# Patient Record
Sex: Male | Born: 1980
Health system: Southern US, Community
[De-identification: ages and names within clinical notes are randomized; demographics above are authoritative.]

## PROBLEM LIST (undated history)

## (undated) DIAGNOSIS — R011 Cardiac murmur, unspecified: Secondary | ICD-10-CM

## (undated) DIAGNOSIS — F329 Major depressive disorder, single episode, unspecified: Secondary | ICD-10-CM

## (undated) DIAGNOSIS — F419 Anxiety disorder, unspecified: Secondary | ICD-10-CM

## (undated) DIAGNOSIS — F32A Depression, unspecified: Secondary | ICD-10-CM

## (undated) DIAGNOSIS — T7840XA Allergy, unspecified, initial encounter: Secondary | ICD-10-CM

## (undated) HISTORY — DX: Cardiac murmur, unspecified: R01.1

## (undated) HISTORY — DX: Anxiety disorder, unspecified: F41.9

## (undated) HISTORY — DX: Depression, unspecified: F32.A

## (undated) HISTORY — DX: Allergy, unspecified, initial encounter: T78.40XA

---
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
## 2005-12-16 ENCOUNTER — Ambulatory Visit: Payer: Self-pay | Admitting: Pulmonary Disease

## 2006-01-10 ENCOUNTER — Ambulatory Visit: Payer: Self-pay | Admitting: Pulmonary Disease

## 2008-04-08 DIAGNOSIS — T7840XA Allergy, unspecified, initial encounter: Secondary | ICD-10-CM | POA: Insufficient documentation

## 2008-04-08 DIAGNOSIS — F988 Other specified behavioral and emotional disorders with onset usually occurring in childhood and adolescence: Secondary | ICD-10-CM

## 2008-04-09 ENCOUNTER — Ambulatory Visit: Payer: Self-pay | Admitting: Pulmonary Disease

## 2008-04-09 DIAGNOSIS — L708 Other acne: Secondary | ICD-10-CM | POA: Insufficient documentation

## 2008-04-13 LAB — CONVERTED CEMR LAB: Herpes Simplex Vrs I&II-IgM Ab (EIA): NOT DETECTED

## 2008-09-30 ENCOUNTER — Ambulatory Visit: Payer: Self-pay | Admitting: Pulmonary Disease

## 2009-10-22 IMAGING — CR DG CHEST 2V
3 series · 3 of 3 positions shown · non-contrast
Comparison: 12/16/2005

CLINICAL DATA: None

CHEST - 2 VIEW

[view not recorded (1 of 3)]
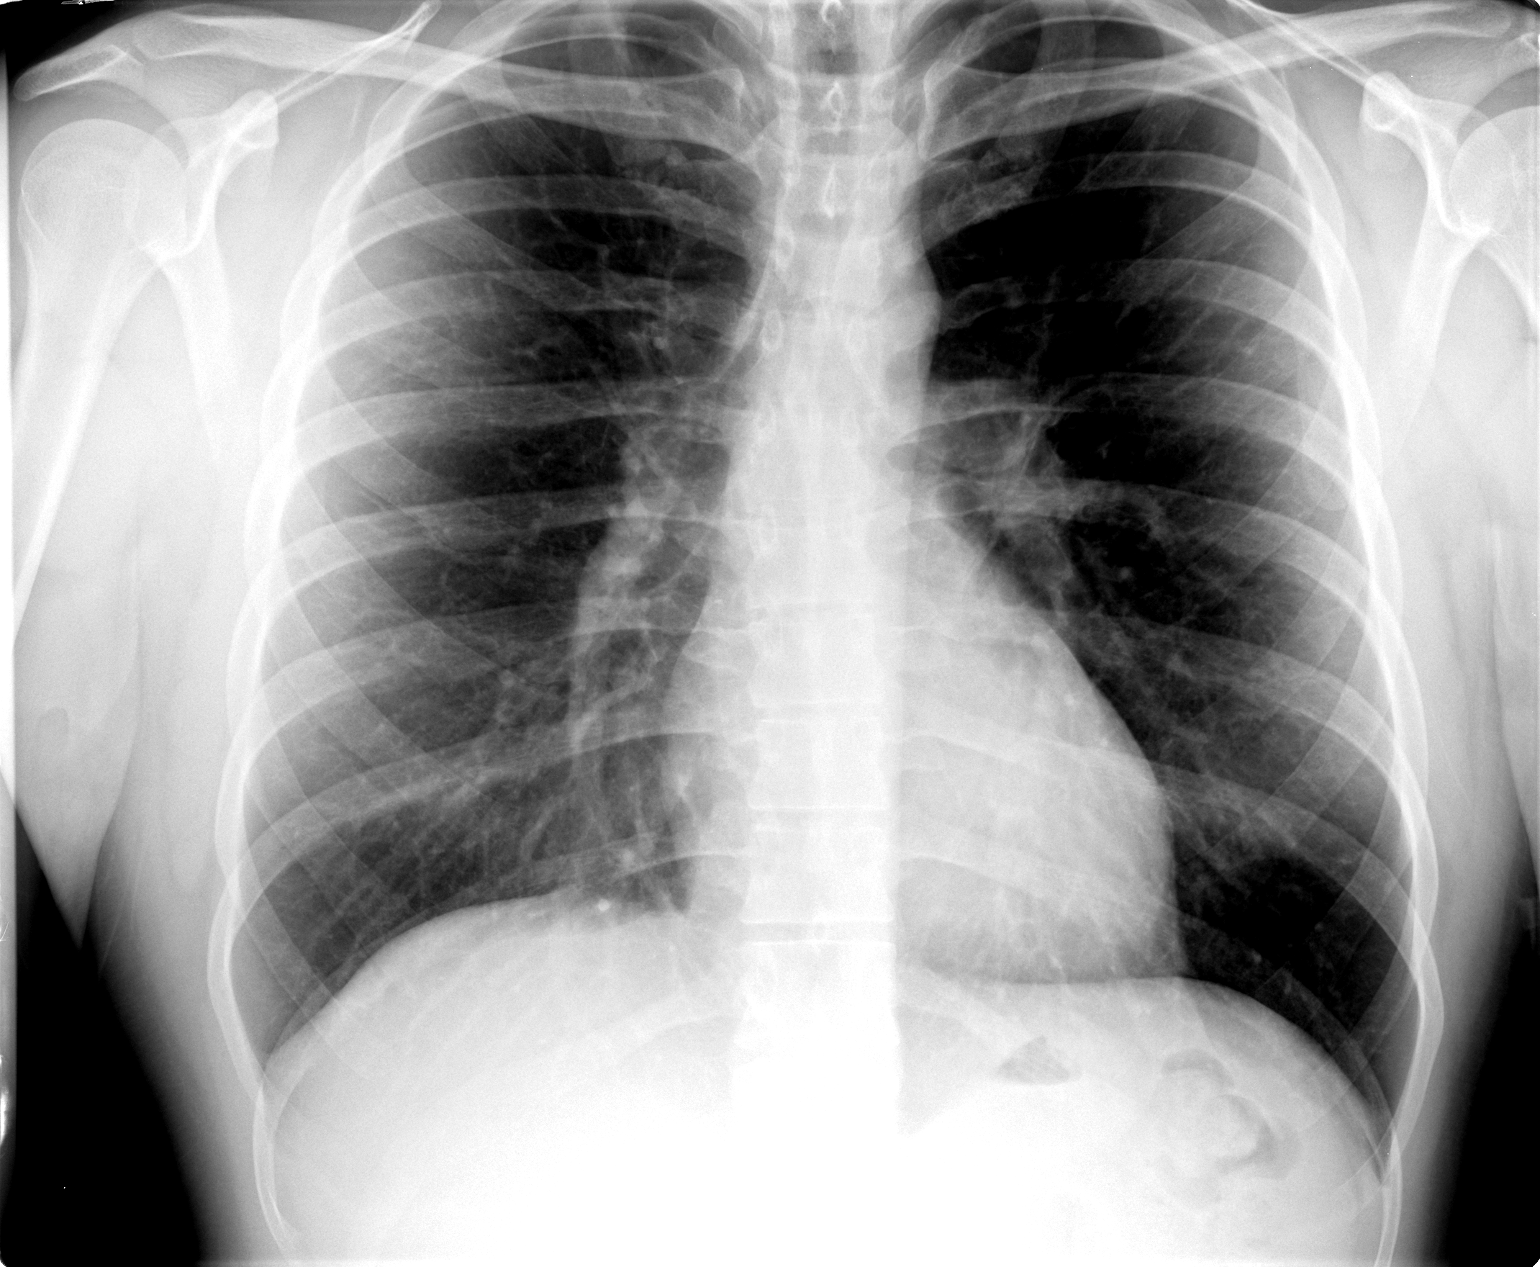

[view not recorded (2 of 3)]
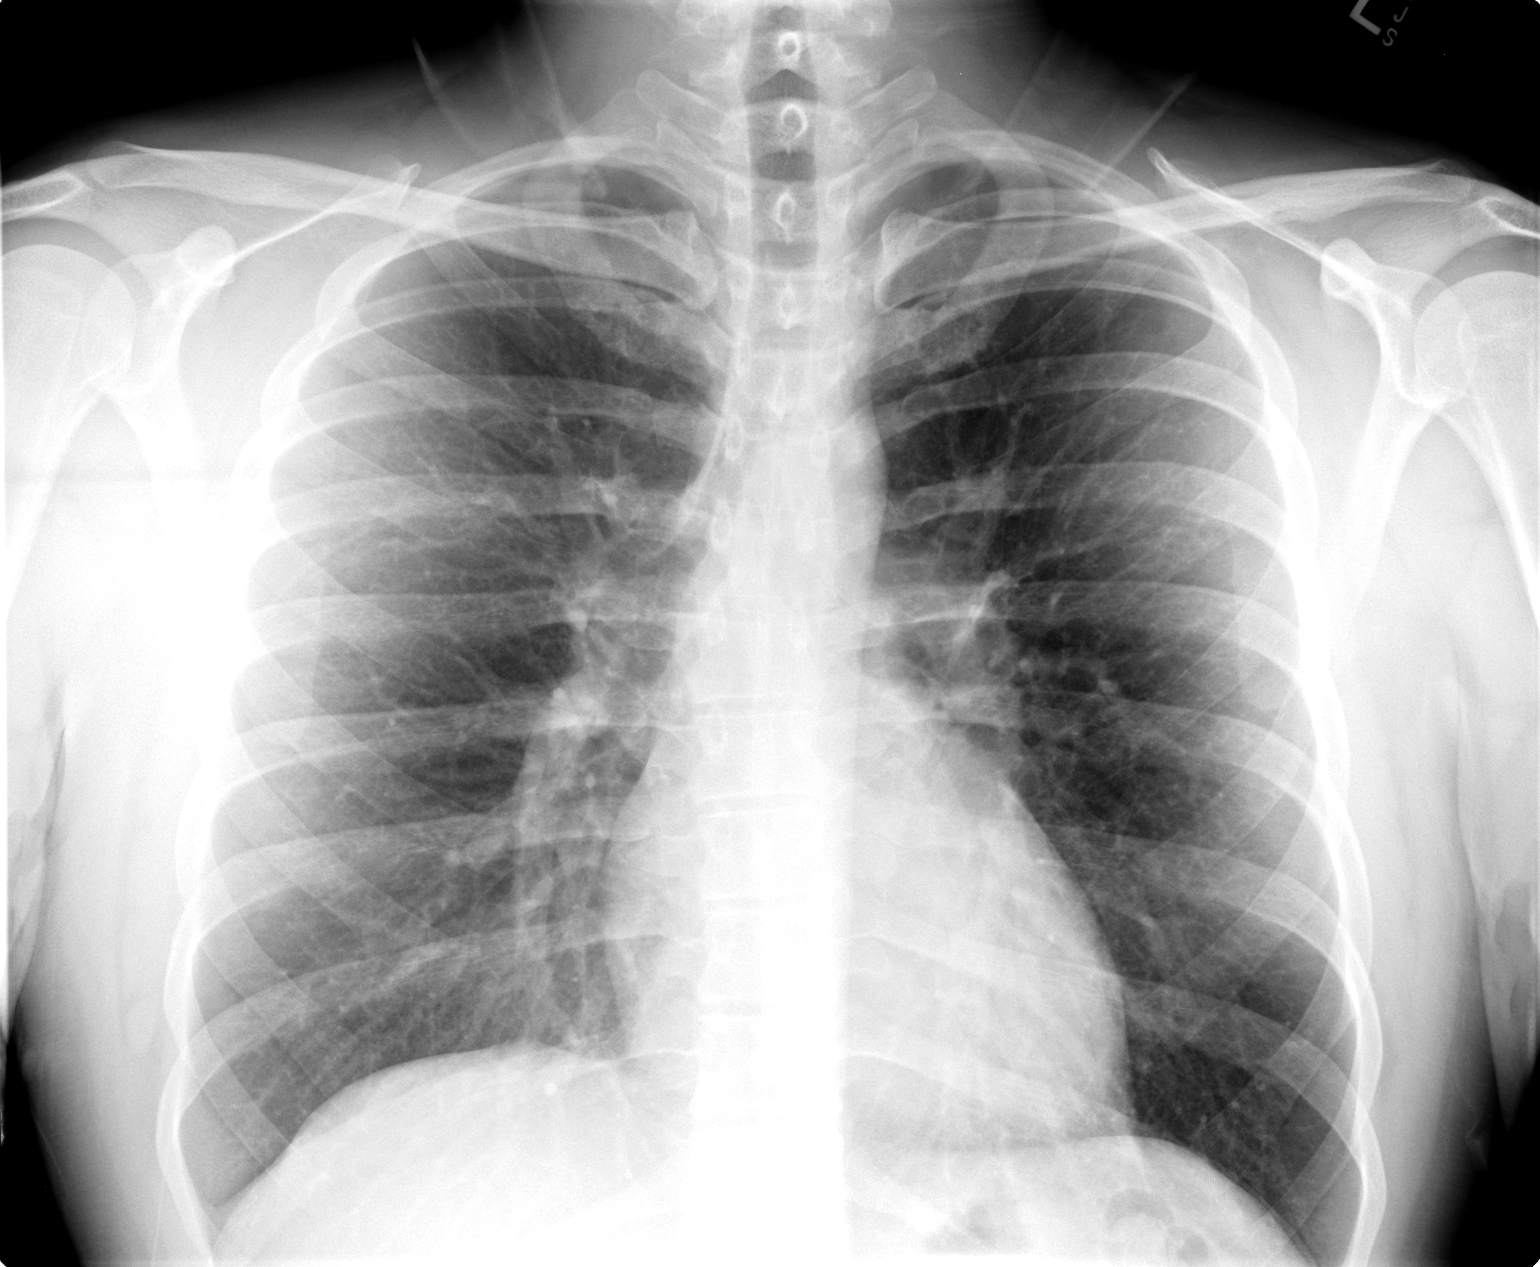

[view not recorded (3 of 3)]
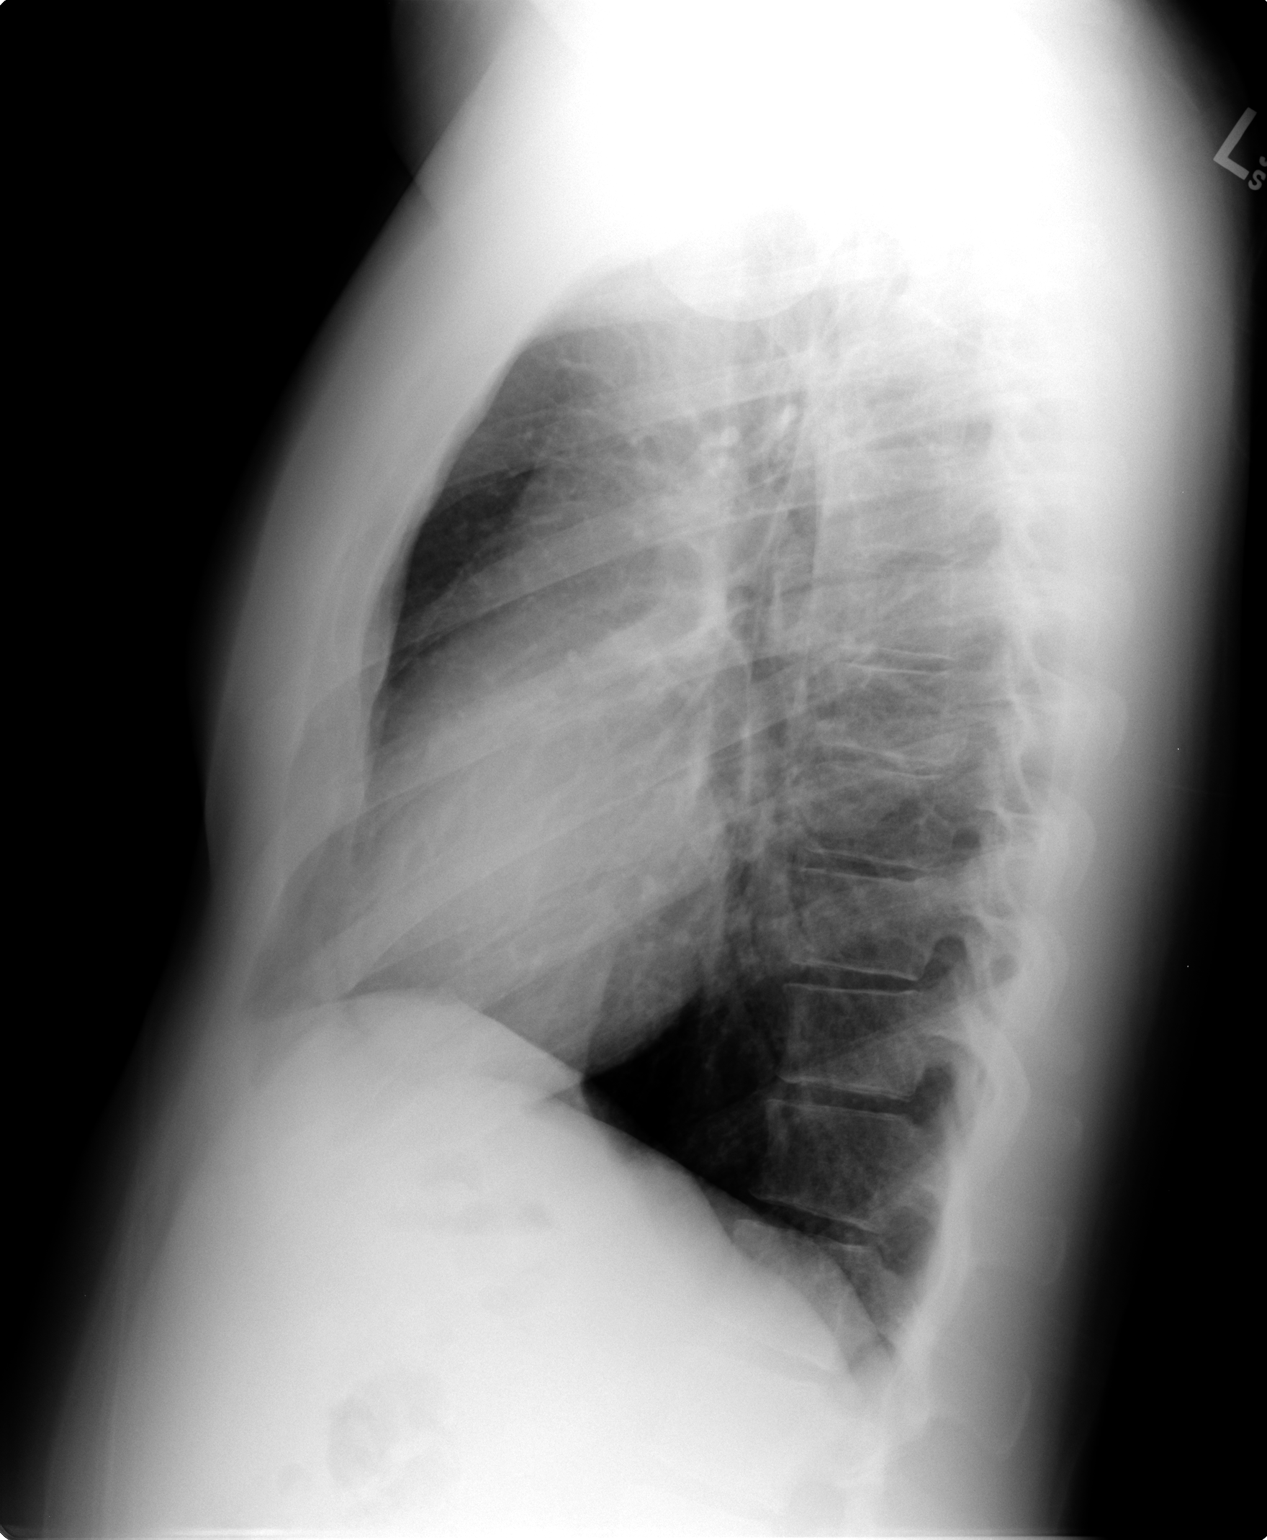

[3 of 3 positions shown; findings below may reference images not displayed]

FINDINGS: The heart size and mediastinal contours are within normal
limits.  Both lungs are clear.  The visualized skeletal structures
are unremarkable.
IMPRESSION: No active disease.,

## 2010-07-16 ENCOUNTER — Encounter: Payer: Self-pay | Admitting: Pulmonary Disease

## 2010-08-05 ENCOUNTER — Ambulatory Visit: Payer: Self-pay | Admitting: Pulmonary Disease

## 2010-08-05 ENCOUNTER — Telehealth (INDEPENDENT_AMBULATORY_CARE_PROVIDER_SITE_OTHER): Payer: Self-pay | Admitting: *Deleted

## 2010-08-05 DIAGNOSIS — J40 Bronchitis, not specified as acute or chronic: Secondary | ICD-10-CM

## 2010-11-14 LAB — CONVERTED CEMR LAB
Basophils Absolute: 0 10*3/uL (ref 0.0–0.1)
Bilirubin, Direct: 0.1 mg/dL (ref 0.0–0.3)
Calcium: 9.1 mg/dL (ref 8.4–10.5)
Cholesterol: 193 mg/dL (ref 0–200)
Crystals: NEGATIVE
Direct LDL: 120.7 mg/dL
GFR calc Af Amer: 114 mL/min
GFR calc non Af Amer: 95 mL/min
HCT: 42.5 % (ref 39.0–52.0)
Hemoglobin, Urine: NEGATIVE
Leukocytes, UA: NEGATIVE
Lymphocytes Relative: 32.6 % (ref 12.0–46.0)
Monocytes Absolute: 0.5 10*3/uL (ref 0.1–1.0)
Monocytes Relative: 9.8 % (ref 3.0–12.0)
Nitrite: NEGATIVE
Platelets: 267 10*3/uL (ref 150–400)
RDW: 11.7 % (ref 11.5–14.6)
Sodium: 136 meq/L (ref 135–145)
TSH: 1.14 microintl units/mL (ref 0.35–5.50)
Total Bilirubin: 1.2 mg/dL (ref 0.3–1.2)
Total CHOL/HDL Ratio: 4.2
Total Protein, Urine: NEGATIVE mg/dL
Triglycerides: 216 mg/dL (ref 0–149)
Urobilinogen, UA: 0.2 (ref 0.0–1.0)
VLDL: 43 mg/dL — ABNORMAL HIGH (ref 0–40)

## 2010-11-18 NOTE — Letter (Signed)
Summary: [facility]  [facility]   Imported By: Lester Benson 07/27/2010 07:55:41  _____________________________________________________________________  External Attachment:    Type:   Image     Comment:   External Document

## 2010-11-18 NOTE — Assessment & Plan Note (Signed)
Summary: ?bronchitis/ok per TD and SN/coming at 2:30pm/mg   CC:  31 month ROV & add-on for bronchitis....  History of Present Illness: 30 y/o WM here for a 2 yr follow up visit & add-on appt for bronchitis...    ~  Jun09:  he enjoys excellent general medical health and has no new complaints or concerns today... he has Adult ADD and is followed by DrReadling in [place] on ADDERALL 10mg - 1.5tabsBid, ZOLOFT 100mg /d, and WELLBUTRIN 100mg /d... and he has PROPRANOLOL to take as needed for anxiety before performances...   ~  August 05, 2010:  add-on appt for URI w/ nasal congestion, drainage, burning sensation and subseq cough, yellow sputum production (no blood, no f/c/s)... he denies CP, palpit, SOB, etc...  he is getting married in 2 weeks!... Exam shows scat rhonchi & CXR= clear, NAD... we decided to Rx w/ Augmentin, Mucinex, Tussionex...   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  PHYSICAL EXAMINATION (ICD-V70.0) - good general health...  ~  FLP 3/07 showed TChol 183, TG 144, HDL 57, LDL 98... continue diet...  ~  10/11:  he has gained 40# over the last 31yrs...  ALLERGY (ICD-995.3) - uses OTC meds as needed.  ADD (ICD-314.00) - followed by Dr Harvie Heck Reidling in [place] on Adderall 10mg - 1.5 tabs Bid, Zoloft 100mg /d, and Wellbutrin 100mg /d...  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     Preventive Screening-Counseling & Management  Alcohol-Tobacco     Smoking Status: never  Allergies: 1)  ! Prednisone  Comments:  Nurse/Medical Assistant: The patient's medications and allergies were reviewed with the patient and were updated in the Medication and Allergy Lists.  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 mother alive age 64 father alive age 50--hx of colon  cancer no siblings  Social History: Reviewed history from 04/09/2008 and no changes required. musician/performer/composer smokes very rarely drinks rarely single no children  Review of Systems      See HPI       The patient complains of dyspnea on exertion and prolonged cough.  The patient denies anorexia, fever, weight loss, weight gain, vision loss, decreased hearing, hoarseness, chest pain, syncope, peripheral edema, headaches, hemoptysis, abdominal pain, melena, hematochezia, severe indigestion/heartburn, hematuria, incontinence, muscle weakness, suspicious skin lesions, transient blindness, difficulty walking, depression, unusual weight change, abnormal bleeding, enlarged lymph nodes, and angioedema.    Vital Signs:  Patient profile:   30 year old male Height:      71 inches Weight:      228 pounds BMI:     31.91 O2 Sat:      97 % on Room air Temp:     97.4 degrees F oral Pulse rate:   113 / minute BP sitting:   140 / 90  (left arm) Cuff size:   regular  Vitals Entered By: Randell Loop CMA (August 05, 2010 3:09 PM)  O2 Sat at Rest %:  97 O2 Flow:  Room air CC: 28 month ROV & add-on for bronchitis... Is Patient Diabetic? No Pain Assessment Patient in pain? no      Comments meds updated today with pt   Physical Exam  Additional Exam:  WD, WN, 30 y/o WM in NAD... GENERAL:  Alert & oriented; pleasant & cooperative... HEENT:  [place]/AT, EOM-wnl, PERRLA, Fundi-benign, EACs-clear,  TMs-wnl, NOSE-clear, THROAT-clear & wnl., PERRLA, Fundi-benign, EACs-clear,  TMs-wnl, NOSE-clear, THROAT-clear & wnl. NECK:  Supple w/ full ROM; no JVD; normal carotid impulses w/o bruits; no thyromegaly or nodules palpated; no lymphadenopathy. CHEST:  Clear to P & A; except for a few scat rhonchi... HEART:  Regular Rhythm; without murmurs/ rubs/ or gallops. ABDOMEN:  Soft & nontender; normal bowel sounds; no organomegaly or masses detected. RECTAL:  Neg - prostate 2+ & nontender w/o nodules; stool hematest neg. EXT: without deformities or arthritic changes; no  varicose veins/ venous insuffic/ or edema. NEURO:  CN's intact; motor testing normal; sensory testing normal; gait normal & balance OK. DERM:  No lesions noted; no rash etc...    CXR  Procedure date:  08/05/2010  Findings:      CHEST - 2 VIEW Comparison: 04/09/2008   Findings: The heart size and vascularity are normal and the lungs are clear.  No osseous abnormality.   IMPRESSION: Normal chest.   Read By:  Gwynn Burly,  M.D.   Impression & Recommendations:  Problem # 1:  BRONCHITIS (ICD-490) We discussed Rx w/ Augmentin, Mucinex, Fluids, & Tussionex Prn... The following medications were removed from the medication list:    Septra 400-80 Mg Tabs (Sulfamethoxazole-trimethoprim) .Marland Kitchen... Take one tablet by mouth two times a day His updated medication list for this problem includes:    Amoxicillin-pot Clavulanate 875-125 Mg Tabs (Amoxicillin-pot clavulanate) .Marland Kitchen... Take 1 tab by mouth two times a day til gone...    Tussionex Pennkinetic Er 10-8 Mg/78ml Lqcr (Hydrocod polst-chlorphen polst) .Marland Kitchen... 1 tsp by mouth every 12 h as needed for cough...  Orders: T-2 View CXR ([id number])  Problem # 2:  OTHER MEDICAL ISSUES AS NOTED>>> He will sche a f/u CPX athis convenience...  Complete Medication List: 1)  Adderall 10 Mg Tabs (Amphetamine-dextroamphetamine) .... Take 2  tablet by mouth two times a day 2)  Zoloft 100 Mg Tabs (Sertraline hcl) .... Take 1 tablet by mouth once a day 3)  Wellbutrin 100 Mg Tabs (Bupropion hcl) .... Take 1 tablet by mouth once a day 4)  Benadryl 25 Mg Caps (Diphenhydramine hcl) .... As needed 5)  Amoxicillin-pot Clavulanate 875-125 Mg Tabs (Amoxicillin-pot clavulanate) .... Take 1 tab by mouth two times a day til gone... 6)  Tussionex Pennkinetic Er 10-8 Mg/55ml Lqcr (Hydrocod polst-chlorphen polst) .Marland Kitchen.. 1 tsp by mouth every 12 h as needed for cough...  Patient Instructions: 1)  Today we updated your med list- see below.... 2)  We decided to treat your  bronchitic infection w/ AUGMENTIN take 1 tab twice daily til gone... and we gave you a perscription for TUSSIONEX to use as needed for cough... 3)  Don't forget the MUCINEX DM- 1-2 tabs twice daily w/ plenty of fluids.Marland KitchenMarland Kitchen 4)  Call for any problems.Marland KitchenMarland Kitchen 5)  Work on weight reduction & consider a follow up physical next year... Prescriptions: TUSSIONEX PENNKINETIC ER 10-8 MG/5ML LQCR (HYDROCOD POLST-CHLORPHEN POLST) 1 tsp by mouth every 12 H as needed for cough...  #4 oz x 2   Entered and Authorized by:   Michele Mcalpine MD   Signed by:   Michele Mcalpine MD on 08/05/2010   Method used:   Print then Give to Patient   RxID:   [id number] AMOXICILLIN-POT CLAVULANATE 875-125 MG TABS (AMOXICILLIN-POT CLAVULANATE) take 1 tab by mouth two times a day til gone...  #14 x 1   Entered and Authorized by:   Michele Mcalpine MD   Signed by:   Michele Mcalpine MD on 08/05/2010  Method used:   Print then Give to Patient   RxID:   [id number]    Immunization History:  Influenza Immunization History:    Influenza:  historical (08/05/2008)

## 2010-11-18 NOTE — Progress Notes (Signed)
Summary: cough/ congestion----ov with SN today  Phone Note Call from Patient Call back at [phone]   Caller: Patient Call For: nadel Summary of Call: chest pains cough and yellow congestion [facility] Initial call taken by: Rickard Patience,  August 05, 2010 8:41 AM  Follow-up for Phone Call        called and spoke with pt. pt believes he may have bronchitis.  Pt c/o chest congestion and coughing up yellow sputum.  Pt hasn't seen SN since June 2009.  Spoke with SN and he ok'd pt to be seen today.  Pt agreed to come in today at 2:30pm for appt with SN.  Aundra Millet Reynolds LPN  August 05, 2010 10:15 AM

## 2011-11-02 ENCOUNTER — Telehealth: Payer: Self-pay | Admitting: Pulmonary Disease

## 2011-11-02 MED ORDER — AMOXICILLIN-POT CLAVULANATE 875-125 MG PO TABS: 1.0000 | ORAL_TABLET | Freq: Two times a day (BID) | ORAL | Status: AC

## 2011-11-02 NOTE — Telephone Encounter (Signed)
Per SN---ok for pt to have augmentin 875mg  #14  1 po bid and align 1 daily while taking the abx.  thanks

## 2011-11-02 NOTE — Telephone Encounter (Signed)
Spoke with pt and he states has had  Cough x 2 days- prod with very minimal light yellow sputum. Denies any f/c/s, SOB, CP, wheeze. He would like something called in before he gets worse. Pt last seen Oct 2011. SN nor TP with any openings today. Please advise, thanks! Allergies  Allergen Reactions  . Prednisone     REACTION: pt states "jittery" from Pred

## 2011-11-02 NOTE — Telephone Encounter (Signed)
Spoke with pt and notified of recs per SN. Pt verbalized understanding and states nothing further needed. Rx was called to pharm.

## 2011-11-05 ENCOUNTER — Ambulatory Visit (INDEPENDENT_AMBULATORY_CARE_PROVIDER_SITE_OTHER): Payer: BC Managed Care – PPO

## 2011-11-05 DIAGNOSIS — B9789 Other viral agents as the cause of diseases classified elsewhere: Secondary | ICD-10-CM

## 2012-02-17 IMAGING — CR DG CHEST 2V
2 series · 2 of 2 positions shown · non-contrast
Comparison: 04/09/2008

CLINICAL DATA: Bronchitis.  Cough.

CHEST - 2 VIEW

[view not recorded (1 of 2)]
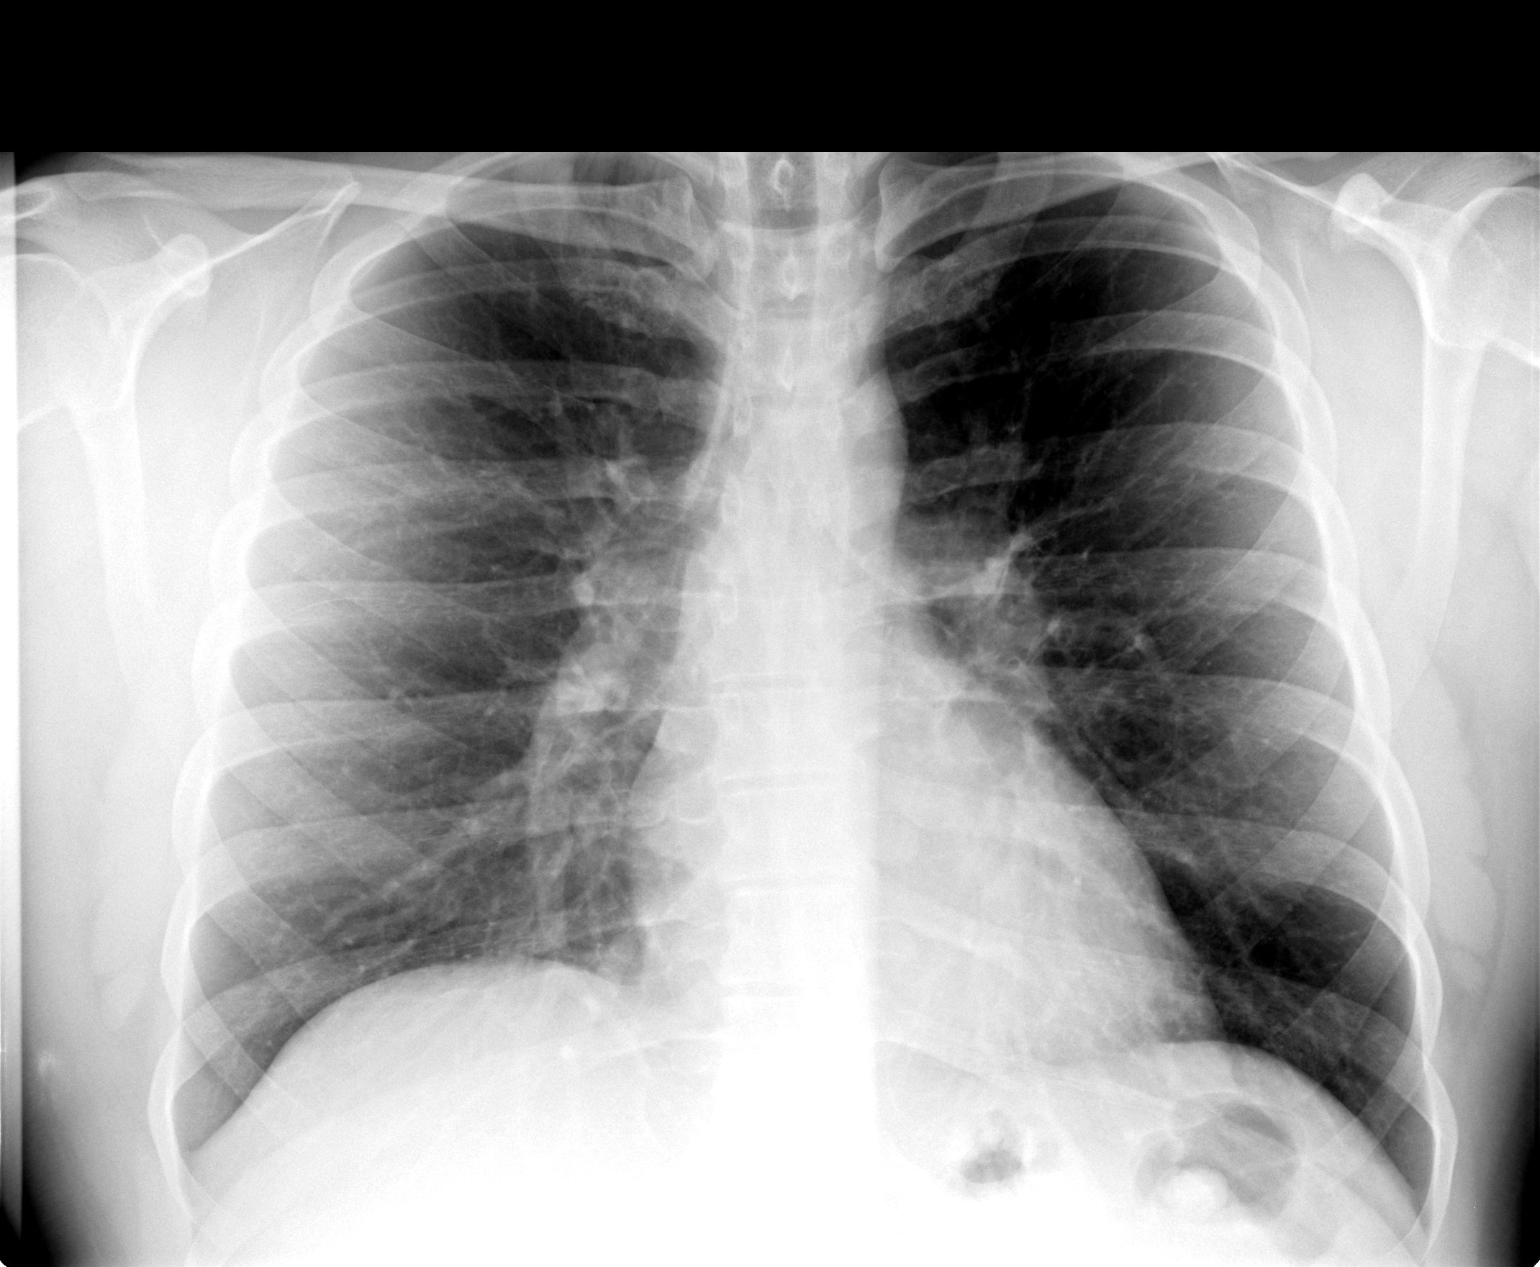

[view not recorded (2 of 2)]
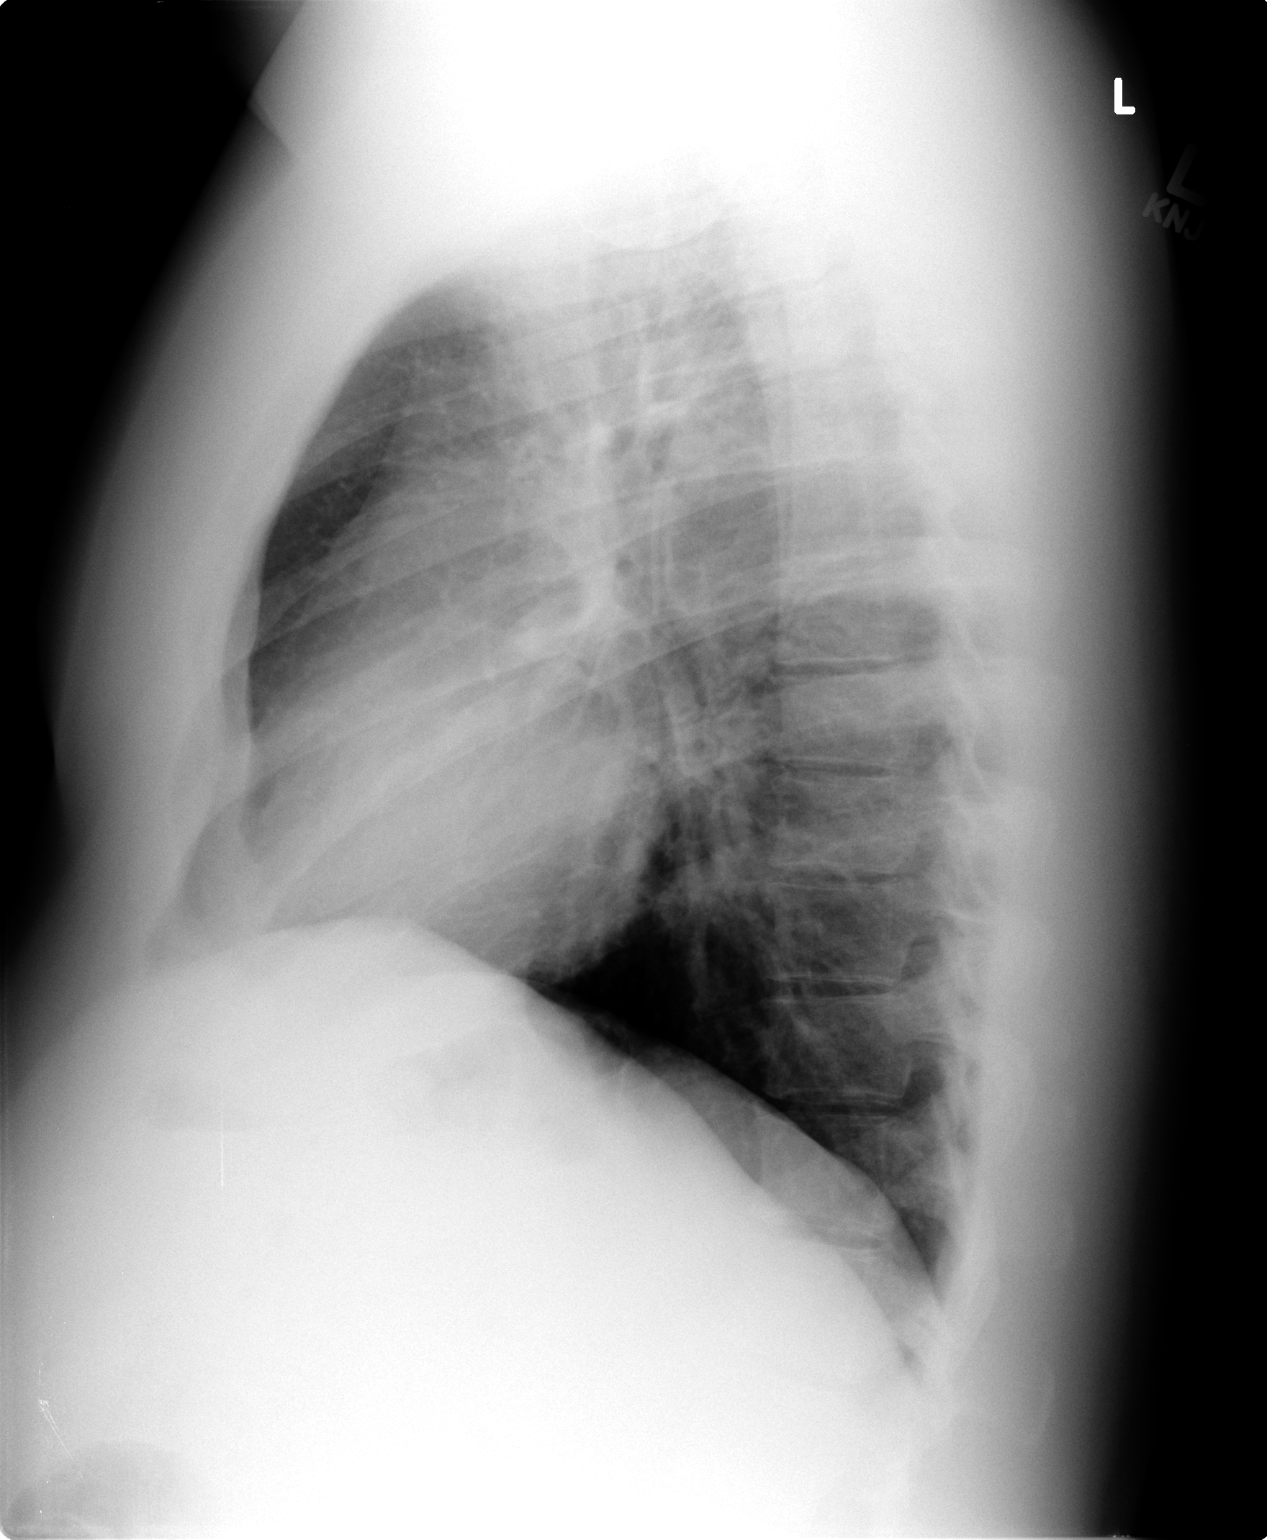

[2 of 2 positions shown; findings below may reference images not displayed]

FINDINGS: The heart size and vascularity are normal and the lungs
are clear.  No osseous abnormality.
IMPRESSION: Normal chest.

## 2012-07-03 ENCOUNTER — Ambulatory Visit (INDEPENDENT_AMBULATORY_CARE_PROVIDER_SITE_OTHER): Payer: BC Managed Care – PPO | Admitting: Family Medicine

## 2012-07-03 VITALS — BP 130/90 | HR 82 | Temp 98.8°F | Resp 18 | Ht 71.5 in | Wt 236.0 lb

## 2012-07-03 DIAGNOSIS — S93409A Sprain of unspecified ligament of unspecified ankle, initial encounter: Secondary | ICD-10-CM

## 2012-07-03 NOTE — Patient Instructions (Addendum)
Thank you for coming in today. You have an ankle sprain. Please wear the brace when active. Come back as needed.  Acute Ankle Sprain with Phase I Rehab An acute ankle sprain is a partial or complete tear in one or more of the ligaments of the ankle due to traumatic injury. The severity of the injury depends on both the the number of ligaments sprained and the grade of sprain. There are 3 grades of sprains.    A grade 1 sprain is a mild sprain. There is a slight pull without obvious tearing. There is no loss of strength, and the muscle and ligament are the correct length.   A grade 2 sprain is a moderate sprain. There is tearing of fibers within the substance of the ligament where it connects two bones or two cartilages. The length of the ligament is increased, and there is usually decreased strength.   A grade 3 sprain is a complete rupture of the ligament and is uncommon.  In addition to the grade of sprain, there are three types of ankle sprains.   Lateral ankle sprains: This is a sprain of one or more of the three ligaments on the outer side (lateral) of the ankle. These are the most common sprains. Medial ankle sprains: There is one large triangular ligament of the inner side (medial) of the ankle that is susceptible to injury. Medial ankle sprains are less common. Syndesmosis, "high ankle," sprains: The syndesmosis is the ligament that connects the two bones of the lower leg. Syndesmosis sprains usually only occur with very severe ankle sprains. SYMPTOMS  Pain, tenderness, and swelling in the ankle, starting at the side of injury that may progress to the whole ankle and foot with time.   "Pop" or tearing sensation at the time of injury.   Bruising that may spread to the heel.   Impaired ability to walk soon after injury.  CAUSES    Acute ankle sprains are caused by trauma placed on the ankle that temporarily forces or pries the anklebone (talus) out of its normal socket.    Stretching or tearing of the ligaments that normally hold the joint in place (usually due to a twisting injury).  RISK INCREASES WITH:  Previous ankle sprain.   Sports in which the foot may land awkwardly (ie. basketball, volleyball, or soccer) or walking or running on uneven or rough surfaces.   Shoes with inadequate support to prevent sideways motion when stress occurs.   Poor strength and flexibility.   Poor balance skills.   Contact sports.  PREVENTION    Warm up and stretch properly before activity.   Maintain physical fitness:   Ankle and leg flexibility, muscle strength, and endurance.   Cardiovascular fitness.   Balance training activities.   Use proper technique and have a coach correct improper technique.   Taping, protective strapping, bracing, or high-top tennis shoes may help prevent injury. Initially, tape is best; however, it loses most of its support function within 10 to 15 minutes.   Wear proper fitted protective shoes (High-top shoes with taping or bracing is more effective than either alone).   Provide the ankle with support during sports and practice activities for 12 months following injury.  PROGNOSIS    If treated properly, ankle sprains can be expected to recover completely; however, the length of recovery depends on the degree of injury.   A grade 1 sprain usually heals enough in 5 to 7 days to allow modified activity and requires  an average of 6 weeks to heal completely.   A grade 2 sprain requires 6 to 10 weeks to heal completely.   A grade 3 sprain requires 12 to 16 weeks to heal.   A syndesmosis sprain often takes more than 3 months to heal.  RELATED COMPLICATIONS    Frequent recurrence of symptoms may result in a chronic problem. Appropriately addressing the problem the first time decreases the frequency of recurrence and optimizes healing time. Severity of the initial sprain does not predict the likelihood of later instability.    Injury to other structures (bone, cartilage, or tendon).   A chronically unstable or arthritic ankle joint is a possiblity with repeated sprains.  TREATMENT Treatment initially involves the use of ice, medication, and compression bandages to help reduce pain and inflammation. Ankle sprains are usually immobilized in a walking cast or boot to allow for healing. Crutches may be recommended to reduce pressure on the injury. After immobilization, strengthening and stretching exercises may be necessary to regain strength and a full range of motion. Surgery is rarely needed to treat ankle sprains. MEDICATION    Nonsteroidal anti-inflammatory medications, such as aspirin and ibuprofen (do not take for the first 3 days after injury or within 7 days before surgery), or other minor pain relievers, such as acetaminophen, are often recommended. Take these as directed by your caregiver. Contact your caregiver immediately if any bleeding, stomach upset, or signs of an allergic reaction occur from these medications.   Ointments applied to the skin may be helpful.   Pain relievers may be prescribed as necessary by your caregiver. Do not take prescription pain medication for longer than 4 to 7 days. Use only as directed and only as much as you need.  HEAT AND COLD  Cold treatment (icing) is used to relieve pain and reduce inflammation for acute and chronic cases. Cold should be applied for 10 to 15 minutes every 2 to 3 hours for inflammation and pain and immediately after any activity that aggravates your symptoms. Use ice packs or an ice massage.   Heat treatment may be used before performing stretching and strengthening activities prescribed by your caregiver. Use a heat pack or a warm soak.  SEEK IMMEDIATE MEDICAL CARE IF:    Pain, swelling, or bruising worsens despite treatment.   You experience pain, numbness, discoloration, or coldness in the foot or toes.   New, unexplained symptoms develop (drugs  used in treatment may produce side effects.)  EXERCISES   PHASE I EXERCISES RANGE OF MOTION (ROM) AND STRETCHING EXERCISES - Ankle Sprain, Acute Phase I, Weeks 1 to 2 These exercises may help you when beginning to restore flexibility in your ankle. You will likely work on these exercises for the 1 to 2 weeks after your injury. Once your physician, physical therapist, or athletic trainer sees adequate progress, he or she will advance your exercises. While completing these exercises, remember:    Restoring tissue flexibility helps normal motion to return to the joints. This allows healthier, less painful movement and activity.   An effective stretch should be held for at least 30 seconds.   A stretch should never be painful. You should only feel a gentle lengthening or release in the stretched tissue.  RANGE OF MOTION - Dorsi/Plantar Flexion  While sitting with your right / left knee straight, draw the top of your foot upwards by flexing your ankle. Then reverse the motion, pointing your toes downward.   Hold each position for __________  seconds.   After completing your first set of exercises, repeat this exercise with your knee bent.  Repeat __________ times. Complete this exercise __________ times per day.   RANGE OF MOTION - Ankle Alphabet  Imagine your right / left big toe is a pen.   Keeping your hip and knee still, write out the entire alphabet with your "pen." Make the letters as large as you can without increasing any discomfort.  Repeat __________ times. Complete this exercise __________ times per day.   STRENGTHENING EXERCISES - Ankle Sprain, Acute -Phase I, Weeks 1 to 2 These exercises may help you when beginning to restore strength in your ankle. You will likely work on these exercises for 1 to 2 weeks after your injury. Once your physician, physical therapist, or athletic trainer sees adequate progress, he or she will advance your exercises. While completing these exercises,  remember:    Muscles can gain both the endurance and the strength needed for everyday activities through controlled exercises.   Complete these exercises as instructed by your physician, physical therapist, or athletic trainer. Progress the resistance and repetitions only as guided.   You may experience muscle soreness or fatigue, but the pain or discomfort you are trying to eliminate should never worsen during these exercises. If this pain does worsen, stop and make certain you are following the directions exactly. If the pain is still present after adjustments, discontinue the exercise until you can discuss the trouble with your clinician.  STRENGTH - Dorsiflexors  Secure a rubber exercise band/tubing to a fixed object (ie. table, pole) and loop the other end around your right / left foot.   Sit on the floor facing the fixed object. The band/tubing should be slightly tense when your foot is relaxed.   Slowly draw your foot back toward you using your ankle and toes.   Hold this position for __________ seconds. Slowly release the tension in the band and return your foot to the starting position.  Repeat __________ times. Complete this exercise __________ times per day.   STRENGTH - Plantar-flexors   Sit with your right / left leg extended. Holding onto both ends of a rubber exercise band/tubing, loop it around the ball of your foot. Keep a slight tension in the band.   Slowly push your toes away from you, pointing them downward.   Hold this position for __________ seconds. Return slowly, controlling the tension in the band/tubing.  Repeat __________ times. Complete this exercise __________ times per day.   STRENGTH - Ankle Eversion  Secure one end of a rubber exercise band/tubing to a fixed object (table, pole). Loop the other end around your foot just before your toes.   Place your fists between your knees. This will focus your strengthening at your ankle.   Drawing the band/tubing  across your opposite foot, slowly, pull your little toe out and up. Make sure the band/tubing is positioned to resist the entire motion.   Hold this position for __________ seconds.  Have your muscles resist the band/tubing as it slowly pulls your foot back to the starting position.   Repeat __________ times. Complete this exercise __________ times per day.   STRENGTH - Ankle Inversion  Secure one end of a rubber exercise band/tubing to a fixed object (table, pole). Loop the other end around your foot just before your toes.   Place your fists between your knees. This will focus your strengthening at your ankle.   Slowly, pull your big toe up  and in, making sure the band/tubing is positioned to resist the entire motion.   Hold this position for __________ seconds.   Have your muscles resist the band/tubing as it slowly pulls your foot back to the starting position.  Repeat __________ times. Complete this exercises __________ times per day.   STRENGTH - Towel Curls  Sit in a chair positioned on a non-carpeted surface.   Place your right / left foot on a towel, keeping your heel on the floor.   Pull the towel toward your heel by only curling your toes. Keep your heel on the floor.   If instructed by your physician, physical therapist, or athletic trainer, add weight to the end of the towel.  Repeat __________ times. Complete this exercise __________ times per day. Document Released: 05/04/2005 Document Revised: 09/22/2011 Document Reviewed: 01/15/2009 [facility] Patient Information 2012 [place], [place].

## 2012-07-03 NOTE — Progress Notes (Signed)
Andrew Bray is a 31 y.o. male who presents to [facility] today for left lateral ankle sprain yesterday. Patient stepped in all. Immediate pain and swelling over the lateral ankle. He is able to walk. Has tried some pain medications which are somewhat effective. Feels well otherwise. No radiating pain weakness or numbness. Worse with activity better with rest.   PMH: Reviewed ADHD History  Substance Use Topics  . Smoking status: Never Smoker   . Smokeless tobacco: Not on file  . Alcohol Use: Not on file   ROS as above  Medications reviewed. Current Outpatient Prescriptions  Medication Sig Dispense Refill  . amphetamine-dextroamphetamine (ADDERALL XR) 30 MG 24 hr capsule Take 30 mg by mouth every morning.      Marland Kitchen buPROPion (WELLBUTRIN) 100 MG tablet Take 100 mg by mouth 2 (two) times daily.      . sertraline (ZOLOFT) 100 MG tablet Take 100 mg by mouth daily.      Marland Kitchen sulfamethoxazole-trimethoprim (BACTRIM DS) 800-160 MG per tablet Take 1 tablet by mouth 2 (two) times daily.        Exam:  BP 130/90  Pulse 82  Temp 98.8 F (37.1 C) (Oral)  Resp 18  Ht 5' 11.5" (1.816 m)  Wt 236 lb (107.049 kg)  BMI 32.46 kg/m2 Gen: Well NAD MUSCULOSKELETAL ANKLE LEFT:  Swollen. Nontender throughout. Normal ankle motion. Positive talar tilt and anterior drawer. Strength is intact to all modalities. Is able to walk normally.  No results found for this or any previous visit (from the past 72 hour(s)).  Assessment and Plan: 31 y.o. male with Mild ankle sprain. I am ankle was negative for fracture. No x-rays indicated. Plan to use ASO ankle brace and NSAIDs as needed.  F/u PRN

## 2013-07-03 ENCOUNTER — Telehealth: Payer: Self-pay | Admitting: Pulmonary Disease

## 2013-07-04 ENCOUNTER — Telehealth: Payer: Self-pay | Admitting: Pulmonary Disease

## 2013-07-04 NOTE — Telephone Encounter (Signed)
I spoke with the pt and he does not want appt for next week. Carron Curie, CMA

## 2013-07-04 NOTE — Telephone Encounter (Signed)
Pt's mom calling again in ref to previous msg can be reached at [phone].Andrew Bray

## 2013-07-04 NOTE — Telephone Encounter (Signed)
Per SN---   We have no openings aval.  TP is out the rest of the week.  Best we can do is an appt next week.  We can schedule this for him.   Go to the ER is he gets worse [facility].  thanks

## 2013-07-04 NOTE — Telephone Encounter (Signed)
I spoke with pt. He has not been seen since 07/2010-not 3 years yet. He has been [facility] for [place]. He is currently of cefdinir and has 1 day left. He is having some chest congestion, cough w/ white phlem, PND, nasal congestion for couple weeks. He is requesting to be seen as he is not getting any better after 2 rounds of abx. Please advise SN thanks  Allergies  Allergen Reactions  . Codeine   . Prednisone     REACTION: pt states "jittery" from Pred

## 2013-07-04 NOTE — Telephone Encounter (Signed)
Error.Andrew Bray ° °

## 2013-07-04 NOTE — Telephone Encounter (Signed)
Patient's mother calling back.  Please return call.

## 2013-07-05 ENCOUNTER — Ambulatory Visit (INDEPENDENT_AMBULATORY_CARE_PROVIDER_SITE_OTHER): Payer: BC Managed Care – PPO | Admitting: Family Medicine

## 2013-07-05 VITALS — BP 144/92 | HR 103 | Temp 99.7°F | Resp 18 | Ht 71.0 in | Wt 244.4 lb

## 2013-07-05 DIAGNOSIS — J309 Allergic rhinitis, unspecified: Secondary | ICD-10-CM

## 2013-07-05 DIAGNOSIS — J329 Chronic sinusitis, unspecified: Secondary | ICD-10-CM

## 2013-07-05 DIAGNOSIS — R0982 Postnasal drip: Secondary | ICD-10-CM

## 2013-07-05 MED ORDER — AMOXICILLIN-POT CLAVULANATE 875-125 MG PO TABS: 1.0000 | ORAL_TABLET | Freq: Two times a day (BID) | ORAL | Status: DC

## 2013-07-05 MED ORDER — FLUTICASONE PROPIONATE 50 MCG/ACT NA SUSP: 2.0000 | Freq: Every day | NASAL | Status: DC

## 2013-07-05 NOTE — Progress Notes (Signed)
[facility]:  Office Visit  Chief Complaint:  Chief Complaint  Patient presents with  . Cough    on and off for the past month; [facility] cefdiner, amoxicillan.  . Nasal Congestion  . Fever    HPI: Andrew Bray is a 32 y.o. male who complains of  1 month ago went to dentist and had work done on his teeth, there was numbing of his mouth and had fillings x 2. Then he had ear infection both OM and OE and that got better. Then 3 weeks after the round of abx he was put on omnicef. He also got the flu injection. Ever since then he has had low grade temp, he would feel better then it comes back again. Wednesday he started getting a lot of drainage. He currently denies ear pain. Everything feels like it is in his chest. 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 He takes Benadryl for allergies. He has been on amoxacillin and cefdinir. 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 He has no problems with his ears currently.  Past Medical History  Diagnosis Date  . Allergy   . Anxiety    History reviewed. No pertinent past surgical history. History   Social History  . Marital Status: Married    Spouse Name: N/A    Number of Children: N/A  . Years of Education: N/A   Social History Main Topics  . Smoking status: Never Smoker   . Smokeless tobacco: None  . Alcohol Use: None  . Drug Use: None  . Sexual Activity: None   Other Topics Concern  . None   Social History Narrative  . None   History reviewed. No pertinent family history. Allergies  Allergen Reactions  . Codeine   . Prednisone     REACTION: pt states "jittery" from Pred   Prior to Admission medications   Medication Sig Start Date End Date Taking? Authorizing Provider  amphetamine-dextroamtamine (ADDERALL XR) 30 MG 24 hr capsule Take 30 mg by mouth every morning.   Yes Historical Provider, MD  buPROPion  (WELLBUTRIN) 100 MG tablet Take 100 mg by mouth 2 (two) times daily.   Yes Historical Provider, MD  PROPRANOLOL HCL PO Take by mouth.   Yes Historical Provider, MD  sertraline (ZOLOFT) 100 MG tablet Take 100 mg by mouth daily.   Yes Historical Provider, MD  sulfamethoxazole-trimethoprim (BACTRIM DS) 800-160 MG per tablet Take 1 tablet by mouth 2 (two) times daily.    Historical Provider, MD     ROS: The patient denies fevers, chills, night sweats, unintentional weight loss, chest pain, palpitations, wheezing, dyspnea on exertion, nausea, vomiting, abdominal pain, dysuria, hematuria, melena, numbness, weakness, or tingling.   All other systems have been reviewed and were otherwise negative with the exception of those mentioned in the HPI and as above.    PHYSICAL EXAM: Filed Vitals:   07/05/13 0759  BP: 144/92  Pulse: 103  Temp: 99.7 F (37.6 C)  Resp: 18   Filed Vitals:   07/05/13 0759  Height: 5\' 11"  (1.803 m)  Weight: 244 lb 6.4 oz (110.859 kg)   Body mass index is 34.1 kg/(m^2).  General: Alert, no acute distress HEENT:  Normocephalic, atraumatic, oropharynx patent. EOMI, PERRLA/ NO exudates, TM with white yeast, fungal like material. + boggy nares, + PND Cardiovascular:  Regular  rate and rhythm, no rubs murmurs or gallops.  No Carotid bruits, radial pulse intact. No pedal edema.  Respiratory: Clear to auscultation bilaterally.  No wheezes, rales, or rhonchi.  No cyanosis, no use of accessory musculature GI: No organomegaly, abdomen is soft and non-tender, positive bowel sounds.  No masses. Skin: No rashes. Neurologic: Facial musculature symmetric. Psychiatric: Patient is appropriate throughout our interaction. Lymphatic: No cervical lymphadenopathy Musculoskeletal: Gait intact.   LABS: Results for orders placed in visit on 04/09/08  CONVERTED [facility] LAB      Result Value Range   Herpes Simplex Vrs I&II-IgM Ab (EIA) NOT DETECTED     HIV NON REAC  NON REAC   RPR NON REAC   NON REAC     EKG/XRAY:   Primary read interpreted by Dr. Conley Rolls at [facility].   ASSESSMENT/PLAN: Encounter Diagnoses  Name Primary?  . Allergic rhinitis Yes  . Post-nasal drip   . Sinusitis    He will try sxs treatment first If no improvement then will try Augmentin in 1 week, early signs of  sinusiits F/u prn Gross sideeffects, risk and benefits, and alternatives of medications d/w patient. Patient is aware that all medications have potential sideeffects and we are unable to predict every sideeffect or drug-drug interaction that may occur.  LE, THAO PHUONG, DO 07/05/2013 8:25 AM

## 2013-07-05 NOTE — Patient Instructions (Signed)
Sinusitis Sinusitis is redness, soreness, and swelling (inflammation) of the paranasal sinuses. Paranasal sinuses are air pockets within the bones of your face (beneath the eyes, the middle of the forehead, or above the eyes). In healthy paranasal sinuses, mucus is able to drain out, and air is able to circulate through them by way of your nose. However, when your paranasal sinuses are inflamed, mucus and air can become trapped. This can allow bacteria and other germs to grow and cause infection. Sinusitis can develop quickly and last only a short time (acute) or continue over a long period (chronic). Sinusitis that lasts for more than 12 weeks is considered chronic.  CAUSES  Causes of sinusitis include:  Allergies.  Structural abnormalities, such as displacement of the cartilage that separates your nostrils (deviated septum), which can decrease the air flow through your nose and sinuses and affect sinus drainage.  Functional abnormalities, such as when the small hairs (cilia) that line your sinuses and help remove mucus do not work properly or are not present. SYMPTOMS  Symptoms of acute and chronic sinusitis are the same. The primary symptoms are pain and pressure around the affected sinuses. Other symptoms include:  Upper toothache.  Earache.  Headache.  Bad breath.  Decreased sense of smell and taste.  A cough, which worsens when you are lying flat.  Fatigue.  Fever.  Thick drainage from your nose, which often is green and may contain pus (purulent).  Swelling and warmth over the affected sinuses. DIAGNOSIS  Your caregiver will perform a physical exam. During the exam, your caregiver may:  Look in your nose for signs of abnormal growths in your nostrils (nasal polyps).  Tap over the affected sinus to check for signs of infection.  View the inside of your sinuses (endoscopy) with a special imaging device with a light attached (endoscope), which is inserted into your  sinuses. If your caregiver suspects that you have chronic sinusitis, one or more of the following tests may be recommended:  Allergy tests.  Nasal culture A sample of mucus is taken from your nose and sent to a lab and screened for bacteria.  Nasal cytology A sample of mucus is taken from your nose and examined by your caregiver to determine if your sinusitis is related to an allergy. TREATMENT  Most cases of acute sinusitis are related to a viral infection and will resolve on their own within 10 days. Sometimes medicines are prescribed to help relieve symptoms (pain medicine, decongestants, nasal steroid sprays, or saline sprays).  However, for sinusitis related to a bacterial infection, your caregiver will prescribe antibiotic medicines. These are medicines that will help kill the bacteria causing the infection.  Rarely, sinusitis is caused by a fungal infection. In theses cases, your caregiver will prescribe antifungal medicine. For some cases of chronic sinusitis, surgery is needed. Generally, these are cases in which sinusitis recurs more than 3 times per year, despite other treatments. HOME CARE INSTRUCTIONS   Drink plenty of water. Water helps thin the mucus so your sinuses can drain more easily.  Use a humidifier.  Inhale steam 3 to 4 times a day (for example, sit in the bathroom with the shower running).  Apply a warm, moist washcloth to your face 3 to 4 times a day, or as directed by your caregiver.  Use saline nasal sprays to help moisten and clean your sinuses.  Take over-the-counter or prescription medicines for pain, discomfort, or fever only as directed by your caregiver. SEEK IMMEDIATE MEDICAL   CARE IF:  You have increasing pain or severe headaches.  You have nausea, vomiting, or drowsiness.  You have swelling around your face.  You have vision problems.  You have a stiff neck.  You have difficulty breathing. MAKE SURE YOU:   Understand these  instructions.  Will watch your condition.  Will get help right away if you are not doing well or get worse. Document Released: 10/03/2005 Document Revised: 12/26/2011 Document Reviewed: 10/18/2011 ExitCare Patient Information 2014 [employer]. Allergic Rhinitis Allergic rhinitis is when the mucous membranes in the nose respond to allergens. Allergens are particles in the air that cause your body to have an allergic reaction. This causes you to release allergic antibodies. Through a chain of events, these eventually cause you to release histamine into the blood stream (hence the use of antihistamines). Although meant to be protective to the body, it is this release that causes your discomfort, such as frequent sneezing, congestion and an itchy runny nose.  CAUSES  The pollen allergens may come from grasses, trees, and weeds. This is seasonal allergic rhinitis, or "hay fever." Other allergens cause year-round allergic rhinitis (perennial allergic rhinitis) such as house dust mite allergen, pet dander and mold spores.  SYMPTOMS   Nasal stuffiness (congestion).  Runny, itchy nose with sneezing and tearing of the eyes.  There is often an itching of the mouth, eyes and ears. It cannot be cured, but it can be controlled with medications. DIAGNOSIS  If you are unable to determine the offending allergen, skin or blood testing may find it. TREATMENT   Avoid the allergen.  Medications and allergy shots (immunotherapy) can help.  Hay fever may often be treated with antihistamines in pill or nasal spray forms. Antihistamines block the effects of histamine. There are over-the-counter medicines that may help with nasal congestion and swelling around the eyes. Check with your caregiver before taking or giving this medicine. If the treatment above does not work, there are many new medications your caregiver can prescribe. Stronger medications may be used if initial measures are ineffective.  Desensitizing injections can be used if medications and avoidance fails. Desensitization is when a patient is given ongoing shots until the body becomes less sensitive to the allergen. Make sure you follow up with your caregiver if problems continue. SEEK MEDICAL CARE IF:   You develop fever (more than 100.5 F (38.1 C).  You develop a cough that does not stop easily (persistent).  You have shortness of breath.  You start wheezing.  Symptoms interfere with normal daily activities. Document Released: 06/28/2001 Document Revised: 12/26/2011 Document Reviewed: 01/07/2009 ExitCare Patient Information 2014 [employer].  

## 2014-09-14 ENCOUNTER — Ambulatory Visit (INDEPENDENT_AMBULATORY_CARE_PROVIDER_SITE_OTHER): Payer: BC Managed Care – PPO | Admitting: Physician Assistant

## 2014-09-14 VITALS — BP 160/114 | HR 103 | Temp 98.2°F | Resp 18 | Ht 72.0 in | Wt 249.0 lb

## 2014-09-14 DIAGNOSIS — J019 Acute sinusitis, unspecified: Secondary | ICD-10-CM

## 2014-09-14 DIAGNOSIS — R030 Elevated blood-pressure reading, without diagnosis of hypertension: Secondary | ICD-10-CM

## 2014-09-14 DIAGNOSIS — J309 Allergic rhinitis, unspecified: Secondary | ICD-10-CM

## 2014-09-14 DIAGNOSIS — R05 Cough: Secondary | ICD-10-CM

## 2014-09-14 DIAGNOSIS — R059 Cough, unspecified: Secondary | ICD-10-CM

## 2014-09-14 DIAGNOSIS — IMO0001 Reserved for inherently not codable concepts without codable children: Secondary | ICD-10-CM

## 2014-09-14 MED ORDER — BENZONATATE 100 MG PO CAPS: 100.0000 mg | ORAL_CAPSULE | Freq: Three times a day (TID) | ORAL | Status: DC | PRN

## 2014-09-14 MED ORDER — CETIRIZINE HCL 10 MG PO TABS: 10.0000 mg | ORAL_TABLET | Freq: Every day | ORAL | Status: DC

## 2014-09-14 MED ORDER — TRIAMCINOLONE ACETONIDE 55 MCG/ACT NA AERO: 2.0000 | INHALATION_SPRAY | Freq: Every day | NASAL | Status: DC

## 2014-09-14 NOTE — Patient Instructions (Signed)
Please check with your psychiatrist about your current medication regimen with regard to your blood pressure. Measure and record your blood pressure every other week and bring those with you on follow up in 3 months. I recommend that you avoid salt in your diet including restaurant foods, frozen meals, fast food. You may eat healthier foods including salads, vegetables, fruits, lean meats including chicken and fish, limit red meat to once a week. Start exercising and trying to lose weight as this can help lower blood pressure.    Sinusitis Sinusitis is redness, soreness, and puffiness (inflammation) of the air pockets in the bones of your face (sinuses). The redness, soreness, and puffiness can cause air and mucus to get trapped in your sinuses. This can allow germs to grow and cause an infection.  HOME CARE   Drink enough fluids to keep your pee (urine) clear or pale yellow.  Use a humidifier in your home.  Run a hot shower to create steam in the bathroom. Sit in the bathroom with the door closed. Breathe in the steam 3-4 times a day.  Put a warm, moist washcloth on your face 3-4 times a day, or as told by your doctor.  Use salt water sprays (saline sprays) to wet the thick fluid in your nose. This can help the sinuses drain.  Only take medicine as told by your doctor. GET HELP RIGHT AWAY IF:   Your pain gets worse.  You have very bad headaches.  You are sick to your stomach (nauseous).  You throw up (vomit).  You are very sleepy (drowsy) all the time.  Your face is puffy (swollen).  Your vision changes.  You have a stiff neck.  You have trouble breathing. MAKE SURE YOU:   Understand these instructions.  Will watch your condition.  Will get help right away if you are not doing well or get worse. Document Released: 03/21/2008 Document Revised: 06/27/2012 Document Reviewed: 05/08/2012 [facility] Patient Information 2015 [place], [place]. This information is not intended to  replace advice given to you by your health care provider. Make sure you discuss any questions you have with your health care provider.

## 2014-09-14 NOTE — Progress Notes (Signed)
I have discussed this case with Mr. Mani, PA-C and agree.  

## 2014-09-14 NOTE — Progress Notes (Signed)
MRN: [id number] DOB: 27-May-1981  Subjective:   Andrew Bray is a 33 y.o. male presenting for 3 day history of dry cough worse when he is sleeping or lying down. Using Robitussin with some relief. Initially patient felt chest congestion but since yesterday has a lot more sinus congestion. Denies fevers, n/v, sinus pain, ear pain, ear drainage, tooth pain, sore throat, difficulty swallowing, chest pain, chest tightness, shob, wheezing, abdominal pain. Has had 1 sick contact, Mother, spent Wednesday with her. Has history of seasonal allergies especially during this season, not taking any medications regularly but admits that Benadryl x2 has helped him in the last week. Denies history of asthma. Admits occasional indigestion but has never been bad enough to need medications. Denies smoking, no alcohol use. Psychiatric medicines managed by Dr. Christianne Dolinandy Reidland, psychiatrist, plans on following up with him soon. Denies any other aggravating or relieving factors, no other questions or concerns.  Prior to Admission medications   Medication Sig Start Date End Date Taking? Authorizing Provider  amphetamine-dextroamphetamine (ADDERALL XR) 30 MG 24 hr capsule Take 30 mg by mouth every morning.   Yes Historical Provider, MD  buPROPion (WELLBUTRIN) 100 MG tablet Take 100 mg by mouth 2 (two) times daily.   Yes Historical Provider, MD  fluticasone (FLONASE) 50 MCG/ACT nasal spray Place 2 sprays into the nose daily. 07/05/13  Yes Thao P Le, DO  sertraline (ZOLOFT) 100 MG tablet Take 100 mg by mouth daily.   Yes Historical Provider, MD  amoxicillin-clavulanate (AUGMENTIN) 875-125 MG per tablet Take 1 tablet by mouth 2 (two) times daily. Patient not taking: Reported on 09/14/2014 07/05/13   Thao P Le, DO  PROPRANOLOL HCL PO Take by mouth.    Historical Provider, MD   Allergies  Allergen Reactions  . Codeine   . Prednisone     REACTION: pt states "jittery" from Pred   Past Medical History  Diagnosis Date  .  Allergy   . Anxiety    Denies past surgical history.   ROS As in subjective.   Objective:   Vitals: BP 160/114 mmHg  Pulse 103  Temp(Src) 98.2 F (36.8 C) (Oral)  Resp 18  Ht 6' (1.829 m)  Wt 249 lb (112.946 kg)  BMI 33.76 kg/m2  SpO2 99%  BP 142/96 on recheck at end of visit by PA-Leeandre Nordling.  BP Readings from Last 3 Encounters:  09/14/14 160/114  07/05/13 144/92  07/03/12 130/90   Physical Exam  Constitutional: He is oriented to person, place, and time and well-developed, well-nourished, and in no distress.  HENT:  TM's flat bilaterally but intact, no effusions or erythema. Nasal turbinates inflamed and dry, thick yellow mucus. No oropharyngeal erythema, tonsillar exudates or edema.  Eyes: Conjunctivae are normal. Pupils are equal, round, and reactive to light. Right eye exhibits no discharge. Left eye exhibits no discharge. No scleral icterus.  Neck: Normal range of motion. No thyromegaly present.  Cardiovascular: Regular rhythm, normal heart sounds and intact distal pulses.  Exam reveals no gallop and no friction rub.   No murmur heard. Pulmonary/Chest: Effort normal and breath sounds normal. No stridor. No respiratory distress. He has no wheezes. He has no rales. He exhibits no tenderness.  Abdominal: Soft. Bowel sounds are normal.  Lymphadenopathy:    He has no cervical adenopathy.  Neurological: He is alert and oriented to person, place, and time.  Skin: Skin is warm and dry. No rash noted. He is not diaphoretic. No erythema.   Assessment and  Plan :   1. Subacute sinusitis, unspecified location - Supportive care advised, if worsening of or no improvement in symptoms in 7-10 days, please call or return to clinic for re-evaluation  2. Allergic rhinitis, unspecified allergic rhinitis type - advised to start consistent allergy meds through end of season - triamcinolone (NASACORT AQ) 55 MCG/ACT AERO nasal inhaler; Place 2 sprays into the nose daily.  Dispense: 1  Inhaler; Refill: 12 - cetirizine (ZYRTEC) 10 MG tablet; Take 1 tablet (10 mg total) by mouth daily.  Dispense: 30 tablet; Refill: 11  3. Cough - benzonatate (TESSALON) 100 MG capsule; Take 1-2 capsules (100-200 mg total) by mouth 3 (three) times daily as needed for cough.  Dispense: 40 capsule; Refill: 0  4. Elevated blood pressure - advised follow up with psychiatrist to discuss meds and possible effects on blood pressure - recommend dietary modifications, exercise and weight loss - check BP every other week and record readings - follow up in 3 months   Wallis BambergMario Azeneth Carbonell, PA-C [facility] [phone] 09/14/2014 8:26 AM

## 2016-02-06 ENCOUNTER — Ambulatory Visit (INDEPENDENT_AMBULATORY_CARE_PROVIDER_SITE_OTHER): Payer: BLUE CROSS/BLUE SHIELD | Admitting: Internal Medicine

## 2016-02-06 VITALS — BP 154/109 | HR 132 | Temp 98.4°F | Resp 20 | Ht 71.0 in | Wt 257.0 lb

## 2016-02-06 DIAGNOSIS — S61451A Open bite of right hand, initial encounter: Secondary | ICD-10-CM | POA: Diagnosis not present

## 2016-02-06 DIAGNOSIS — W540XXA Bitten by dog, initial encounter: Principal | ICD-10-CM

## 2016-02-06 DIAGNOSIS — Z23 Encounter for immunization: Secondary | ICD-10-CM | POA: Diagnosis not present

## 2016-02-06 DIAGNOSIS — R030 Elevated blood-pressure reading, without diagnosis of hypertension: Secondary | ICD-10-CM | POA: Diagnosis not present

## 2016-02-06 DIAGNOSIS — IMO0001 Reserved for inherently not codable concepts without codable children: Secondary | ICD-10-CM

## 2016-02-06 MED ORDER — AMOXICILLIN-POT CLAVULANATE 875-125 MG PO TABS
1.0000 | ORAL_TABLET | Freq: Two times a day (BID) | ORAL | Status: DC
Start: 2016-02-06 — End: 2019-07-04

## 2016-02-06 NOTE — Progress Notes (Signed)
Subjective:  By signing my name below, I, Raven Small, attest that this documentation has been prepared under the direction and in the presence of Ellamae Siaobert Maryana Pittmon, MD.  Electronically Signed: Andrew Auaven Small, ED Scribe. 02/06/2016. 3:17 PM.   Patient ID: Andrew Bray, male    DOB: 03-06-1981, 35 y.o.   MRN: [id number]  HPI Chief Complaint  Patient presents with  . Animal Bite    right hand, dog bite    HPI Comments: Andrew Bray is a 35 y.o. male who presents to the [facility] complaining of animal bite to right hand. Pt states he was bit by another dog trying to stop his dog from getting attack while a the dog park. Bleeding is controlled at this time. He is unsure of last tetanus.   Patient Active Problem List   Diagnosis Date Noted  . Ankle sprain 07/03/2012  . BRONCHITIS 08/05/2010  . ACNE, MILD 04/09/2008  . ADD 04/08/2008  . ALLERGY 04/08/2008   Past Medical History  Diagnosis Date  . Allergy   . Anxiety    No past surgical history on file. Allergies  Allergen Reactions  . Codeine   . Prednisone     REACTION: pt states "jittery" from Pred   Prior to Admission medications   Medication Sig Start Date End Date Taking? Authorizing Provider  amphetamine-dextroamphetamine (ADDERALL XR) 30 MG 24 hr capsule Take 30 mg by mouth every morning.   Yes Historical Provider, MD  buPROPion (WELLBUTRIN) 100 MG tablet Take 100 mg by mouth 2 (two) times daily.   Yes Historical Provider, MD  cetirizine (ZYRTEC) 10 MG tablet Take 1 tablet (10 mg total) by mouth daily. 09/14/14  Yes Wallis BambergMario Mani, PA-C  fluticasone (FLONASE) 50 MCG/ACT nasal spray Place 2 sprays into the nose daily. 07/05/13  Yes Thao P Le, DO  PROPRANOLOL HCL PO Take by mouth.   Yes Historical Provider, MD  sertraline (ZOLOFT) 100 MG tablet Take 100 mg by mouth daily.   Yes Historical Provider, MD  triamcinolone (NASACORT AQ) 55 MCG/ACT AERO nasal inhaler Place 2 sprays into the nose daily. 09/14/14   Yes Wallis BambergMario Mani, PA-C   Social History   Social History  . Marital Status: Married    Spouse Name: N/A  . Number of Children: N/A  . Years of Education: N/A   Occupational History  . Not on file.   Social History Main Topics  . Smoking status: Never Smoker   . Smokeless tobacco: Not on file  . Alcohol Use: Not on file  . Drug Use: Not on file  . Sexual Activity: Not on file   Other Topics Concern  . Not on file   Social History Narrative   Review of Systems  Skin: Positive for wound.  Neurological: Negative for weakness and numbness.       Objective:   Physical Exam  Constitutional: He is oriented to person, place, and time. He appears well-developed and well-nourished. No distress.  HENT:  Head: Normocephalic and atraumatic.  Eyes: Conjunctivae and EOM are normal.  Neck: Neck supple.  Cardiovascular: Normal rate.   Pulmonary/Chest: Effort normal.  Musculoskeletal: Normal range of motion.  Neurological: He is alert and oriented to person, place, and time.  Skin: Skin is warm and dry.  3 puncture wounds on proximal phalanx of the right thumb. Without involvement of the tendons or bones  Psychiatric: He has a normal mood and affect. His behavior is normal.  Nursing note  and vitals reviewed.   Filed Vitals:   02/06/16 1448 02/06/16 1450  BP: 166/105 154/109  Pulse: 126 132  Temp: 98.4 F (36.9 C)   Resp: 20   Height:  (1.803 m)   Weight: 257 lb (116.574 kg)      Assessment & Plan:   1. Dog bite of hand, right, initial encounter   2. Elevated BP    --Reck when healthy  Orders Placed This Encounter  Procedures  . Tdap vaccine greater than or equal to 7yo IM   Keep wound clean Tdap updated Meds ordered this encounter  Medications  . amoxicillin-clavulanate (AUGMENTIN) 875-125 MG tablet    Sig: Take 1 tablet by mouth 2 (two) times daily.    Dispense:  20 tablet    Refill:  0   I have completed the patient encounter in its entirety as  documented by the scribe, with editing by me where necessary. Jaquelyn Sakamoto P. Merla Riches, M.D.

## 2016-02-06 NOTE — Patient Instructions (Addendum)
   IF you received an x-ray today, you will receive an invoice from [facility]. Please contact [facility] at [phone] with questions or concerns regarding your invoice.   IF you received labwork today, you will receive an invoice from [facility]. Please contact [facility] at [phone] with questions or concerns regarding your invoice.   Our billing staff will not be able to assist you with questions regarding bills from these companies.  You will be contacted with the lab results as soon as they are available. The fastest way to get your results is to activate your My Chart account. Instructions are located on the last page of this paperwork. If you have not heard from us regarding the results in 2 weeks, please contact this office.     Tdap Vaccine (Tetanus, Diphtheria and Pertussis): What You Need to Know 1. Why get vaccinated? Tetanus, diphtheria and pertussis are very serious diseases. Tdap vaccine can protect us from these diseases. And, Tdap vaccine given to pregnant women can protect newborn babies against pertussis. TETANUS (Lockjaw) is rare in the [place] today. It causes painful muscle tightening and stiffness, usually all over the body.  It can lead to tightening of muscles in the head and neck so you can't open your mouth, swallow, or sometimes even breathe. Tetanus kills about 1 out of 10 people who are infected even after receiving the best medical care. DIPHTHERIA is also rare in the [place] today. It can cause a thick coating to form in the back of the throat.  It can lead to breathing problems, heart failure, paralysis, and death. PERTUSSIS (Whooping Cough) causes severe coughing spells, which can cause difficulty breathing, vomiting and disturbed sleep.  It can also lead to weight loss, incontinence, and rib fractures. Up to 2 in 100 adolescents and 5 in 100 adults with pertussis are hospitalized or have  complications, which could include pneumonia or death. These diseases are caused by bacteria. Diphtheria and pertussis are spread from person to person through secretions from coughing or sneezing. Tetanus enters the body through cuts, scratches, or wounds. Before vaccines, as many as 200,000 cases of diphtheria, 200,000 cases of pertussis, and hundreds of cases of tetanus, were reported in the [place] each year. Since vaccination began, reports of cases for tetanus and diphtheria have dropped by about 99% and for pertussis by about 80%. 2. Tdap vaccine Tdap vaccine can protect adolescents and adults from tetanus, diphtheria, and pertussis. One dose of Tdap is routinely given at age 11 or 12. People who did not get Tdap at that age should get it as soon as possible. Tdap is especially important for healthcare professionals and anyone having close contact with a baby younger than 12 months. Pregnant women should get a dose of Tdap during every pregnancy, to protect the newborn from pertussis. Infants are most at risk for severe, life-threatening complications from pertussis. Another vaccine, called Td, protects against tetanus and diphtheria, but not pertussis. A Td booster should be given every 10 years. Tdap may be given as one of these boosters if you have never gotten Tdap before. Tdap may also be given after a severe cut or burn to prevent tetanus infection. Your doctor or the person giving you the vaccine can give you more information. Tdap may safely be given at the same time as other vaccines. 3. Some people should not get this vaccine  A person who has ever had a life-threatening allergic reaction after a   previous dose of any diphtheria, tetanus or pertussis containing vaccine, OR has a severe allergy to any part of this vaccine, should not get Tdap vaccine. Tell the person giving the vaccine about any severe allergies.  Anyone who had coma or long repeated seizures within 7 days after a  childhood dose of DTP or DTaP, or a previous dose of Tdap, should not get Tdap, unless a cause other than the vaccine was found. They can still get Td.  Talk to your doctor if you:  have seizures or another nervous system problem,  had severe pain or swelling after any vaccine containing diphtheria, tetanus or pertussis,  ever had a condition called Guillain-Barr Syndrome (GBS),  aren't feeling well on the day the shot is scheduled. 4. Risks With any medicine, including vaccines, there is a chance of side effects. These are usually mild and go away on their own. Serious reactions are also possible but are rare. Most people who get Tdap vaccine do not have any problems with it. Mild problems following Tdap (Did not interfere with activities)  Pain where the shot was given (about 3 in 4 adolescents or 2 in 3 adults)  Redness or swelling where the shot was given (about 1 person in 5)  Mild fever of at least 100.4F (up to about 1 in 25 adolescents or 1 in 100 adults)  Headache (about 3 or 4 people in 10)  Tiredness (about 1 person in 3 or 4)  Nausea, vomiting, diarrhea, stomach ache (up to 1 in 4 adolescents or 1 in 10 adults)  Chills, sore joints (about 1 person in 10)  Body aches (about 1 person in 3 or 4)  Rash, swollen glands (uncommon) Moderate problems following Tdap (Interfered with activities, but did not require medical attention)  Pain where the shot was given (up to 1 in 5 or 6)  Redness or swelling where the shot was given (up to about 1 in 16 adolescents or 1 in 12 adults)  Fever over 102F (about 1 in 100 adolescents or 1 in 250 adults)  Headache (about 1 in 7 adolescents or 1 in 10 adults)  Nausea, vomiting, diarrhea, stomach ache (up to 1 or 3 people in 100)  Swelling of the entire arm where the shot was given (up to about 1 in 500). Severe problems following Tdap (Unable to perform usual activities; required medical attention)  Swelling, severe pain,  bleeding and redness in the arm where the shot was given (rare). Problems that could happen after any vaccine:  People sometimes faint after a medical procedure, including vaccination. Sitting or lying down for about 15 minutes can help prevent fainting, and injuries caused by a fall. Tell your doctor if you feel dizzy, or have vision changes or ringing in the ears.  Some people get severe pain in the shoulder and have difficulty moving the arm where a shot was given. This happens very rarely.  Any medication can cause a severe allergic reaction. Such reactions from a vaccine are very rare, estimated at fewer than 1 in a million doses, and would happen within a few minutes to a few hours after the vaccination. As with any medicine, there is a very remote chance of a vaccine causing a serious injury or death. The safety of vaccines is always being monitored. For more information, visit: www.cdc.gov/vaccinesafety/ 5. What if there is a serious problem? What should I look for?  Look for anything that concerns you, such as signs of a severe   allergic reaction, very high fever, or unusual behavior.  Signs of a severe allergic reaction can include hives, swelling of the face and throat, difficulty breathing, a fast heartbeat, dizziness, and weakness. These would usually start a few minutes to a few hours after the vaccination. What should I do?  If you think it is a severe allergic reaction or other emergency that can't wait, call 9-1-1 or get the person to the nearest hospital. Otherwise, call your doctor.  Afterward, the reaction should be reported to the Vaccine Adverse Event Reporting System (VAERS). Your doctor might file this report, or you can do it yourself through the VAERS web site at www.vaers.hhs.gov, or by calling 1-800-822-7967. VAERS does not give medical advice.  6. The National Vaccine Injury Compensation Program The National Vaccine Injury Compensation Program (VICP) is a federal  program that was created to compensate people who may have been injured by certain vaccines. Persons who believe they may have been injured by a vaccine can learn about the program and about filing a claim by calling 1-800-338-2382 or visiting the VICP website at www.hrsa.gov/vaccinecompensation. There is a time limit to file a claim for compensation. 7. How can I learn more?  Ask your doctor. He or she can give you the vaccine package insert or suggest other sources of information.  Call your local or state health department.  Contact the Centers for Disease Control and Prevention (CDC):  Call 1-800-232-4636 (1-800-CDC-INFO) or  Visit CDC's website at www.cdc.gov/vaccines CDC Tdap Vaccine VIS (12/10/13)   This information is not intended to replace advice given to you by your health care provider. Make sure you discuss any questions you have with your health care provider.   Document Released: 04/03/2012 Document Revised: 10/24/2014 Document Reviewed: 01/15/2014 Elsevier Interactive Patient Education 2016 Elsevier Inc.  

## 2017-09-27 DIAGNOSIS — F419 Anxiety disorder, unspecified: Secondary | ICD-10-CM | POA: Diagnosis not present

## 2017-11-01 DIAGNOSIS — F419 Anxiety disorder, unspecified: Secondary | ICD-10-CM | POA: Diagnosis not present

## 2017-11-15 DIAGNOSIS — F419 Anxiety disorder, unspecified: Secondary | ICD-10-CM | POA: Diagnosis not present

## 2017-12-13 DIAGNOSIS — F419 Anxiety disorder, unspecified: Secondary | ICD-10-CM | POA: Diagnosis not present

## 2017-12-27 DIAGNOSIS — F419 Anxiety disorder, unspecified: Secondary | ICD-10-CM | POA: Diagnosis not present

## 2018-01-24 DIAGNOSIS — F419 Anxiety disorder, unspecified: Secondary | ICD-10-CM | POA: Diagnosis not present

## 2018-02-21 DIAGNOSIS — F419 Anxiety disorder, unspecified: Secondary | ICD-10-CM | POA: Diagnosis not present

## 2018-07-19 DIAGNOSIS — F411 Generalized anxiety disorder: Secondary | ICD-10-CM | POA: Diagnosis not present

## 2018-08-25 DIAGNOSIS — F411 Generalized anxiety disorder: Secondary | ICD-10-CM | POA: Diagnosis not present

## 2018-11-03 DIAGNOSIS — F411 Generalized anxiety disorder: Secondary | ICD-10-CM | POA: Diagnosis not present

## 2019-03-13 DIAGNOSIS — F411 Generalized anxiety disorder: Secondary | ICD-10-CM | POA: Diagnosis not present

## 2019-03-30 DIAGNOSIS — F411 Generalized anxiety disorder: Secondary | ICD-10-CM | POA: Diagnosis not present

## 2019-06-03 ENCOUNTER — Telehealth: Payer: Self-pay | Admitting: Internal Medicine

## 2019-06-03 NOTE — Telephone Encounter (Signed)
Copied from [place] [phone]. Topic: Appointment Scheduling - Scheduling Inquiry for Clinic >> Jun 03, 2019 10:16 AM Rayann Heman wrote: Reason for CRM: pt mother called and stated that he would like to schedule a np with DR crawford. Pts parents are both patients of hers. Please advise

## 2019-06-04 DIAGNOSIS — F411 Generalized anxiety disorder: Secondary | ICD-10-CM | POA: Diagnosis not present

## 2019-06-11 NOTE — Telephone Encounter (Signed)
Fine to establish 

## 2019-06-12 NOTE — Telephone Encounter (Signed)
Tried calling patient but VM was full.

## 2019-06-26 NOTE — Telephone Encounter (Signed)
Pt scheduled  

## 2019-07-04 ENCOUNTER — Other Ambulatory Visit: Payer: Self-pay

## 2019-07-04 ENCOUNTER — Ambulatory Visit (INDEPENDENT_AMBULATORY_CARE_PROVIDER_SITE_OTHER): Payer: BLUE CROSS/BLUE SHIELD | Admitting: Internal Medicine

## 2019-07-04 ENCOUNTER — Encounter: Payer: Self-pay | Admitting: Internal Medicine

## 2019-07-04 VITALS — BP 140/110 | HR 98 | Temp 98.8°F | Ht 71.0 in | Wt 223.0 lb

## 2019-07-04 DIAGNOSIS — R030 Elevated blood-pressure reading, without diagnosis of hypertension: Secondary | ICD-10-CM

## 2019-07-04 DIAGNOSIS — F418 Other specified anxiety disorders: Secondary | ICD-10-CM | POA: Diagnosis not present

## 2019-07-04 DIAGNOSIS — I10 Essential (primary) hypertension: Secondary | ICD-10-CM | POA: Insufficient documentation

## 2019-07-04 DIAGNOSIS — F988 Other specified behavioral and emotional disorders with onset usually occurring in childhood and adolescence: Secondary | ICD-10-CM | POA: Diagnosis not present

## 2019-07-04 DIAGNOSIS — Z23 Encounter for immunization: Secondary | ICD-10-CM

## 2019-07-04 NOTE — Progress Notes (Signed)
   Subjective:   Patient ID: Andrew Bray, male    DOB: Jul 01, 1981, 38 y.o.   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   PMH, [facility], social history reviewed and updated  Review of Systems  Constitutional: Negative.   HENT: Negative.   Eyes: Negative.   Respiratory: Negative for cough, chest tightness and shortness of breath.   Cardiovascular: Negative for chest pain, palpitations and leg swelling.  Gastrointestinal: Negative for abdominal distention, abdominal pain, constipation, diarrhea, nausea and vomiting.  Musculoskeletal: Negative.   Skin: Negative.   Neurological: Negative.   Psychiatric/Behavioral: Negative.     Objective:  Physical Exam Constitutional:      Appearance: He is well-developed. He is obese.  HENT:     Head: Normocephalic and atraumatic.  Neck:     Musculoskeletal: Normal range of motion.  Cardiovascular:     Rate and Rhythm: Normal rate and regular rhythm.  Pulmonary:     Effort: Pulmonary effort is normal. No respiratory distress.     Breath sounds: Normal breath sounds. No wheezing or rales.  Abdominal:     General: Bowel sounds are normal. There is no distension.     Palpations: Abdomen is soft.     Tenderness: There is no abdominal tenderness. There is no rebound.  Skin:    General: Skin is warm and dry.  Neurological:     Mental Status: He is  alert and oriented to person, place, and time.     Coordination: Coordination normal.     Vitals:   07/04/19 0757  BP: (!) 140/110  Pulse: 98  Temp: 98.8 F (37.1 C)  TempSrc: Oral  SpO2: 98%  Weight: 223 lb (101.2 kg)  Height: 5\' 11"  (1.803 m)    Assessment & Plan:  Flu shot given at visit

## 2019-07-04 NOTE — Patient Instructions (Signed)

## 2019-07-04 NOTE — Assessment & Plan Note (Signed)
Seeing psych for meds and taking adderall different pills but total daily dose 40 mg. Counseled about long term risk of hypertension and potential increase in CV risk.

## 2019-07-04 NOTE — Assessment & Plan Note (Signed)
Takes propranolol BID now to help and this helps reduce sweating and HR with performance.

## 2019-07-04 NOTE — Assessment & Plan Note (Signed)
Talked with him about risk of elevated BP with adderall as well as increased risk given family history. He is doing some exercise and advised some weight loss and DASH diet. He will check BP occasionally. Has had normal readings at home.

## 2019-07-27 DIAGNOSIS — F411 Generalized anxiety disorder: Secondary | ICD-10-CM | POA: Diagnosis not present

## 2019-09-04 DIAGNOSIS — F411 Generalized anxiety disorder: Secondary | ICD-10-CM | POA: Diagnosis not present

## 2019-10-29 DIAGNOSIS — F411 Generalized anxiety disorder: Secondary | ICD-10-CM | POA: Diagnosis not present

## 2019-12-14 ENCOUNTER — Ambulatory Visit: Payer: BC Managed Care – PPO | Attending: Internal Medicine

## 2019-12-14 DIAGNOSIS — Z23 Encounter for immunization: Secondary | ICD-10-CM | POA: Insufficient documentation

## 2019-12-14 NOTE — Progress Notes (Signed)
   Covid-19 Vaccination Clinic  Name:  Andrew Bray    MRN: [id number] DOB: 01/10/1981  12/14/2019  Mr. Andrew Bray was observed post Covid-19 immunization for 15 minutes without incidence. He was provided with Vaccine Information Sheet and instruction to access the V-Safe system.   Mr. Andrew Bray was instructed to call 911 with any severe reactions post vaccine: Marland Kitchen Difficulty breathing  . Swelling of your face and throat  . A fast heartbeat  . A bad rash all over your body  . Dizziness and weakness    Immunizations Administered    Name Date Dose VIS Date Route   Pfizer COVID-19 Vaccine 12/14/2019  5:49 PM 0.3 mL 09/27/2019 Intramuscular   Manufacturer: [employer], [employer]   Lot: [id number]   NDC: [address]-8

## 2020-01-04 ENCOUNTER — Ambulatory Visit: Payer: BC Managed Care – PPO | Attending: Internal Medicine

## 2020-01-04 DIAGNOSIS — Z23 Encounter for immunization: Secondary | ICD-10-CM

## 2020-01-04 NOTE — Progress Notes (Signed)
   Covid-19 Vaccination Clinic  Name:  ESAIAS CLEAVENGER    MRN: [id number] DOB: Sep 23, 1981  01/04/2020  Mr. Manson Passey was observed post Covid-19 immunization for 15 minutes without incident. He was provided with Vaccine Information Sheet and instruction to access the V-Safe system.   Mr. Manson Passey was instructed to call 911 with any severe reactions post vaccine: Marland Kitchen Difficulty breathing  . Swelling of face and throat  . A fast heartbeat  . A bad rash all over body  . Dizziness and weakness   Immunizations Administered    Name Date Dose VIS Date Route   Pfizer COVID-19 Vaccine 01/04/2020  9:05 AM 0.3 mL 09/27/2019 Intramuscular   Manufacturer: [employer], [employer]   Lot: [id number]   NDC: [address]-6

## 2020-01-30 DIAGNOSIS — F411 Generalized anxiety disorder: Secondary | ICD-10-CM | POA: Diagnosis not present

## 2020-02-20 DIAGNOSIS — F411 Generalized anxiety disorder: Secondary | ICD-10-CM | POA: Diagnosis not present

## 2020-04-16 DIAGNOSIS — F411 Generalized anxiety disorder: Secondary | ICD-10-CM | POA: Diagnosis not present

## 2020-05-07 DIAGNOSIS — F411 Generalized anxiety disorder: Secondary | ICD-10-CM | POA: Diagnosis not present

## 2020-05-28 DIAGNOSIS — F411 Generalized anxiety disorder: Secondary | ICD-10-CM | POA: Diagnosis not present

## 2020-06-10 DIAGNOSIS — M67432 Ganglion, left wrist: Secondary | ICD-10-CM | POA: Diagnosis not present

## 2020-06-10 DIAGNOSIS — G5602 Carpal tunnel syndrome, left upper limb: Secondary | ICD-10-CM | POA: Diagnosis not present

## 2020-06-25 DIAGNOSIS — F411 Generalized anxiety disorder: Secondary | ICD-10-CM | POA: Diagnosis not present

## 2020-07-09 ENCOUNTER — Other Ambulatory Visit: Payer: Self-pay

## 2020-07-09 ENCOUNTER — Ambulatory Visit (INDEPENDENT_AMBULATORY_CARE_PROVIDER_SITE_OTHER): Payer: BC Managed Care – PPO

## 2020-07-09 DIAGNOSIS — F411 Generalized anxiety disorder: Secondary | ICD-10-CM | POA: Diagnosis not present

## 2020-07-09 DIAGNOSIS — Z23 Encounter for immunization: Secondary | ICD-10-CM

## 2020-07-09 NOTE — Progress Notes (Signed)
Regular flu vaccine given w/o complications.

## 2020-07-22 DIAGNOSIS — G5602 Carpal tunnel syndrome, left upper limb: Secondary | ICD-10-CM | POA: Diagnosis not present

## 2020-07-28 ENCOUNTER — Ambulatory Visit: Payer: Self-pay | Attending: Critical Care Medicine

## 2020-07-28 DIAGNOSIS — Z23 Encounter for immunization: Secondary | ICD-10-CM

## 2020-07-28 NOTE — Progress Notes (Signed)
   Covid-19 Vaccination Clinic  Name:  Andrew Bray    MRN: [id number] DOB: 06/08/1981  07/28/2020  Mr. Andrew Bray was observed post Covid-19 immunization for 15 minutes without incident. He was provided with Vaccine Information Sheet and instruction to access the V-Safe system.   Mr. Andrew Bray was instructed to call 911 with any severe reactions post vaccine: Marland Kitchen Difficulty breathing  . Swelling of face and throat  . A fast heartbeat  . A bad rash all over body  . Dizziness and weakness

## 2020-07-30 DIAGNOSIS — F411 Generalized anxiety disorder: Secondary | ICD-10-CM | POA: Diagnosis not present

## 2020-08-29 DIAGNOSIS — F411 Generalized anxiety disorder: Secondary | ICD-10-CM | POA: Diagnosis not present

## 2020-09-17 DIAGNOSIS — F411 Generalized anxiety disorder: Secondary | ICD-10-CM | POA: Diagnosis not present

## 2020-09-23 DIAGNOSIS — H01115 Allergic dermatitis of left lower eyelid: Secondary | ICD-10-CM | POA: Diagnosis not present

## 2020-09-23 DIAGNOSIS — H01114 Allergic dermatitis of left upper eyelid: Secondary | ICD-10-CM | POA: Diagnosis not present

## 2020-09-25 DIAGNOSIS — H01114 Allergic dermatitis of left upper eyelid: Secondary | ICD-10-CM | POA: Diagnosis not present

## 2020-10-05 DIAGNOSIS — H04123 Dry eye syndrome of bilateral lacrimal glands: Secondary | ICD-10-CM | POA: Diagnosis not present

## 2020-10-05 DIAGNOSIS — H10413 Chronic giant papillary conjunctivitis, bilateral: Secondary | ICD-10-CM | POA: Diagnosis not present

## 2020-10-05 DIAGNOSIS — H5213 Myopia, bilateral: Secondary | ICD-10-CM | POA: Diagnosis not present

## 2020-10-05 DIAGNOSIS — H01114 Allergic dermatitis of left upper eyelid: Secondary | ICD-10-CM | POA: Diagnosis not present

## 2020-10-13 DIAGNOSIS — L239 Allergic contact dermatitis, unspecified cause: Secondary | ICD-10-CM | POA: Diagnosis not present

## 2020-10-22 DIAGNOSIS — F411 Generalized anxiety disorder: Secondary | ICD-10-CM | POA: Diagnosis not present

## 2020-12-03 DIAGNOSIS — F411 Generalized anxiety disorder: Secondary | ICD-10-CM | POA: Diagnosis not present

## 2020-12-24 DIAGNOSIS — F411 Generalized anxiety disorder: Secondary | ICD-10-CM | POA: Diagnosis not present

## 2021-01-14 DIAGNOSIS — F411 Generalized anxiety disorder: Secondary | ICD-10-CM | POA: Diagnosis not present

## 2021-02-18 DIAGNOSIS — F411 Generalized anxiety disorder: Secondary | ICD-10-CM | POA: Diagnosis not present

## 2021-03-04 DIAGNOSIS — F411 Generalized anxiety disorder: Secondary | ICD-10-CM | POA: Diagnosis not present

## 2021-03-13 DIAGNOSIS — F411 Generalized anxiety disorder: Secondary | ICD-10-CM | POA: Diagnosis not present

## 2021-03-25 DIAGNOSIS — F411 Generalized anxiety disorder: Secondary | ICD-10-CM | POA: Diagnosis not present

## 2021-04-10 DIAGNOSIS — F411 Generalized anxiety disorder: Secondary | ICD-10-CM | POA: Diagnosis not present

## 2021-04-22 DIAGNOSIS — F411 Generalized anxiety disorder: Secondary | ICD-10-CM | POA: Diagnosis not present

## 2021-05-07 DIAGNOSIS — F411 Generalized anxiety disorder: Secondary | ICD-10-CM | POA: Diagnosis not present

## 2021-05-20 DIAGNOSIS — F411 Generalized anxiety disorder: Secondary | ICD-10-CM | POA: Diagnosis not present

## 2021-05-28 DIAGNOSIS — F411 Generalized anxiety disorder: Secondary | ICD-10-CM | POA: Diagnosis not present

## 2021-06-17 DIAGNOSIS — F411 Generalized anxiety disorder: Secondary | ICD-10-CM | POA: Diagnosis not present

## 2021-07-01 DIAGNOSIS — F411 Generalized anxiety disorder: Secondary | ICD-10-CM | POA: Diagnosis not present

## 2021-07-08 DIAGNOSIS — F411 Generalized anxiety disorder: Secondary | ICD-10-CM | POA: Diagnosis not present

## 2021-07-13 ENCOUNTER — Other Ambulatory Visit: Payer: Self-pay

## 2021-07-13 ENCOUNTER — Ambulatory Visit (INDEPENDENT_AMBULATORY_CARE_PROVIDER_SITE_OTHER): Payer: BC Managed Care – PPO

## 2021-07-13 DIAGNOSIS — Z23 Encounter for immunization: Secondary | ICD-10-CM | POA: Diagnosis not present

## 2021-07-24 DIAGNOSIS — F411 Generalized anxiety disorder: Secondary | ICD-10-CM | POA: Diagnosis not present

## 2021-08-05 DIAGNOSIS — F411 Generalized anxiety disorder: Secondary | ICD-10-CM | POA: Diagnosis not present

## 2021-08-12 DIAGNOSIS — L309 Dermatitis, unspecified: Secondary | ICD-10-CM | POA: Diagnosis not present

## 2021-08-12 DIAGNOSIS — L858 Other specified epidermal thickening: Secondary | ICD-10-CM | POA: Diagnosis not present

## 2021-08-26 DIAGNOSIS — F411 Generalized anxiety disorder: Secondary | ICD-10-CM | POA: Diagnosis not present

## 2021-08-26 DIAGNOSIS — M79641 Pain in right hand: Secondary | ICD-10-CM | POA: Diagnosis not present

## 2021-08-26 DIAGNOSIS — M79642 Pain in left hand: Secondary | ICD-10-CM | POA: Diagnosis not present

## 2021-09-02 DIAGNOSIS — F411 Generalized anxiety disorder: Secondary | ICD-10-CM | POA: Diagnosis not present

## 2021-09-04 DIAGNOSIS — F411 Generalized anxiety disorder: Secondary | ICD-10-CM | POA: Diagnosis not present

## 2021-09-25 DIAGNOSIS — F411 Generalized anxiety disorder: Secondary | ICD-10-CM | POA: Diagnosis not present

## 2021-10-05 DIAGNOSIS — H04123 Dry eye syndrome of bilateral lacrimal glands: Secondary | ICD-10-CM | POA: Diagnosis not present

## 2021-10-05 DIAGNOSIS — H524 Presbyopia: Secondary | ICD-10-CM | POA: Diagnosis not present

## 2021-10-07 DIAGNOSIS — F411 Generalized anxiety disorder: Secondary | ICD-10-CM | POA: Diagnosis not present

## 2021-10-22 DIAGNOSIS — F411 Generalized anxiety disorder: Secondary | ICD-10-CM | POA: Diagnosis not present

## 2021-11-05 DIAGNOSIS — F411 Generalized anxiety disorder: Secondary | ICD-10-CM | POA: Diagnosis not present

## 2021-11-18 DIAGNOSIS — F411 Generalized anxiety disorder: Secondary | ICD-10-CM | POA: Diagnosis not present

## 2021-11-25 ENCOUNTER — Ambulatory Visit (INDEPENDENT_AMBULATORY_CARE_PROVIDER_SITE_OTHER): Payer: BC Managed Care – PPO

## 2021-11-25 ENCOUNTER — Ambulatory Visit: Payer: BC Managed Care – PPO | Admitting: Podiatry

## 2021-11-25 ENCOUNTER — Other Ambulatory Visit: Payer: Self-pay

## 2021-11-25 DIAGNOSIS — M7989 Other specified soft tissue disorders: Secondary | ICD-10-CM | POA: Diagnosis not present

## 2021-11-25 DIAGNOSIS — S90122A Contusion of left lesser toe(s) without damage to nail, initial encounter: Secondary | ICD-10-CM

## 2021-11-25 MED ORDER — CEPHALEXIN 500 MG PO CAPS: 500.0000 mg | ORAL_CAPSULE | Freq: Three times a day (TID) | ORAL | 0 refills | Status: AC

## 2021-11-30 ENCOUNTER — Encounter: Payer: Self-pay | Admitting: Podiatry

## 2021-11-30 NOTE — Progress Notes (Signed)
°  Subjective:  Patient ID: Andrew Bray, male    DOB: Jun 24, 1981,  MRN: [id number]  Chief Complaint  Patient presents with   Ingrown Toenail     (np) Toe redness and swelling on left foot, 2nd toenail    41 y.o. male presents with the above complaint. History confirmed with patient.  He had redness and swelling around the second toe of the left foot going on for about 2 months now.  Started slowly and has gradually worsened.  Denies any ingrowing nails or purulent drainage from the toe.  Also denies any traumatic injury.  Objective:  Physical Exam: warm, good capillary refill, no trophic changes or ulcerative lesions, normal DP and PT pulses, normal sensory exam, and there is erythema around the second toenail with edema that extends into the toe pulp to the level of the DIPJ.  Toenail is well attached.  No signs of infection other than the erythema.  It is nontender.  No bruising.  Good joint range of motion.  Radiographs: Multiple views x-ray of the left foot: There is no evidence of fracture dislocation or other acute osseous abnormalities noted on plain film x-rays Assessment:   1. Contusion of second toe of left foot, initial encounter   2. Swelling of toe of left foot      Plan:  Patient was evaluated and treated and all questions answered.  Discussed my clinical and radiographic findings with him.  We reviewed the x-rays together.  I discussed with him this could be cellulitis of the toe although the toenail does not appear to be currently infected.  Also discussed that there is a possibility that this is a rheumatological issue with inflammation.  I prescribed him lab work to have this evaluated for dactylitis possible psoriatic arthritis.  Also placed him on Keflex as a precaution in the event this is cellulitis of the toe.  I will see him back in a few weeks for reevaluation if it does not improve may consider temporary nail avulsion.  Return in about 4 weeks (around  12/23/2021) for after lab work to review, check left 2nd toe.

## 2021-12-01 LAB — RHEUMATOID FACTOR: Rheumatoid fact SerPl-aCnc: <10 IU/mL (ref <14.0)

## 2021-12-01 LAB — CBC WITH DIFFERENTIAL/PLATELET
Basophils Absolute: 0.1 10*3/uL (ref 0.0–0.2)
Basos: 1 %
EOS (ABSOLUTE): 0.3 10*3/uL (ref 0.0–0.4)
Eos: 3 %
Hematocrit: 45.7 % (ref 37.5–51.0)
Hemoglobin: 15.6 g/dL (ref 13.0–17.7)
Immature Grans (Abs): 0 10*3/uL (ref 0.0–0.1)
Immature Granulocytes: 1 %
Lymphocytes Absolute: 2.8 10*3/uL (ref 0.7–3.1)
Lymphs: 33 %
MCH: 29.1 pg (ref 26.6–33.0)
MCHC: 34.1 g/dL (ref 31.5–35.7)
MCV: 85 fL (ref 79–97)
Monocytes Absolute: 0.7 10*3/uL (ref 0.1–0.9)
Monocytes: 8 %
Neutrophils Absolute: 4.6 10*3/uL (ref 1.4–7.0)
Neutrophils: 54 %
Platelets: 321 10*3/uL (ref 150–450)
RBC: 5.37 x10E6/uL (ref 4.14–5.80)
RDW: 12.8 % (ref 11.6–15.4)
WBC: 8.5 10*3/uL (ref 3.4–10.8)

## 2021-12-01 LAB — HLA-B27 ANTIGEN: HLA B27: NEGATIVE

## 2021-12-01 LAB — URIC ACID: Uric Acid: 4.7 mg/dL (ref 3.8–8.4)

## 2021-12-01 LAB — SEDIMENTATION RATE: Sed Rate: 4 mm/hr (ref 0–15)

## 2021-12-07 DIAGNOSIS — F411 Generalized anxiety disorder: Secondary | ICD-10-CM | POA: Diagnosis not present

## 2021-12-23 ENCOUNTER — Other Ambulatory Visit: Payer: Self-pay

## 2021-12-23 ENCOUNTER — Ambulatory Visit: Payer: BC Managed Care – PPO | Admitting: Podiatry

## 2021-12-23 DIAGNOSIS — M7989 Other specified soft tissue disorders: Secondary | ICD-10-CM

## 2021-12-23 NOTE — Progress Notes (Signed)
?  Subjective:  ?Patient ID: Andrew Bray, male    DOB: 01/20/1981,  MRN: [id number] ? ?Chief Complaint  ?Patient presents with  ? Nail Problem  ?  Left foot 2nd toe  ?Pt stated that he thinks its better   ? ? ?41 y.o. male presents with the above complaint. History confirmed with patient.  Doing much better after taking the antibiotics ? ?Objective:  ?Physical Exam: ?warm, good capillary refill, no trophic changes or ulcerative lesions, normal DP and PT pulses, normal sensory exam, and  erythema and tenderness has resolved ? ?Radiographs: ?Multiple views x-ray of the left foot: There is no evidence of fracture dislocation or other acute osseous abnormalities noted on plain film x-rays ? ?Lab work unrevealing for any signs of RA dactylitis or psoriatic arthritis or deep infection ?Assessment:  ? ?1. Swelling of toe of left foot   ? ? ? ?Plan:  ?Patient was evaluated and treated and all questions answered. ? ?I reviewed the results of his lab work with him and that there is no evidence of deep infection psoriatic arthritis or dactylitis.  I discussed with him that I think is likely was cellulitis secondary to infection from the nailbed and it has resolved completely.  He will return to see me as needed for this if it returns ? ?Return if symptoms worsen or fail to improve.  ? ?

## 2021-12-25 DIAGNOSIS — F411 Generalized anxiety disorder: Secondary | ICD-10-CM | POA: Diagnosis not present

## 2022-01-06 DIAGNOSIS — F411 Generalized anxiety disorder: Secondary | ICD-10-CM | POA: Diagnosis not present

## 2022-01-22 DIAGNOSIS — F411 Generalized anxiety disorder: Secondary | ICD-10-CM | POA: Diagnosis not present

## 2022-01-27 DIAGNOSIS — F411 Generalized anxiety disorder: Secondary | ICD-10-CM | POA: Diagnosis not present

## 2022-02-05 DIAGNOSIS — F411 Generalized anxiety disorder: Secondary | ICD-10-CM | POA: Diagnosis not present

## 2022-02-13 DIAGNOSIS — F411 Generalized anxiety disorder: Secondary | ICD-10-CM | POA: Diagnosis not present

## 2022-02-20 DIAGNOSIS — F411 Generalized anxiety disorder: Secondary | ICD-10-CM | POA: Diagnosis not present

## 2022-02-24 DIAGNOSIS — F411 Generalized anxiety disorder: Secondary | ICD-10-CM | POA: Diagnosis not present

## 2022-03-10 DIAGNOSIS — F411 Generalized anxiety disorder: Secondary | ICD-10-CM | POA: Diagnosis not present

## 2022-03-24 DIAGNOSIS — F411 Generalized anxiety disorder: Secondary | ICD-10-CM | POA: Diagnosis not present

## 2022-04-07 DIAGNOSIS — F411 Generalized anxiety disorder: Secondary | ICD-10-CM | POA: Diagnosis not present

## 2022-04-21 DIAGNOSIS — F411 Generalized anxiety disorder: Secondary | ICD-10-CM | POA: Diagnosis not present

## 2022-04-28 DIAGNOSIS — F411 Generalized anxiety disorder: Secondary | ICD-10-CM | POA: Diagnosis not present

## 2022-05-07 DIAGNOSIS — F411 Generalized anxiety disorder: Secondary | ICD-10-CM | POA: Diagnosis not present

## 2022-05-19 DIAGNOSIS — F411 Generalized anxiety disorder: Secondary | ICD-10-CM | POA: Diagnosis not present

## 2022-06-02 DIAGNOSIS — F411 Generalized anxiety disorder: Secondary | ICD-10-CM | POA: Diagnosis not present

## 2022-06-09 DIAGNOSIS — F411 Generalized anxiety disorder: Secondary | ICD-10-CM | POA: Diagnosis not present

## 2022-06-16 DIAGNOSIS — F411 Generalized anxiety disorder: Secondary | ICD-10-CM | POA: Diagnosis not present

## 2022-06-28 DIAGNOSIS — F411 Generalized anxiety disorder: Secondary | ICD-10-CM | POA: Diagnosis not present

## 2022-07-09 DIAGNOSIS — F411 Generalized anxiety disorder: Secondary | ICD-10-CM | POA: Diagnosis not present

## 2022-07-26 DIAGNOSIS — F411 Generalized anxiety disorder: Secondary | ICD-10-CM | POA: Diagnosis not present

## 2022-08-16 DIAGNOSIS — F411 Generalized anxiety disorder: Secondary | ICD-10-CM | POA: Diagnosis not present

## 2022-08-23 DIAGNOSIS — F411 Generalized anxiety disorder: Secondary | ICD-10-CM | POA: Diagnosis not present

## 2022-08-27 DIAGNOSIS — F411 Generalized anxiety disorder: Secondary | ICD-10-CM | POA: Diagnosis not present

## 2022-09-06 DIAGNOSIS — F411 Generalized anxiety disorder: Secondary | ICD-10-CM | POA: Diagnosis not present

## 2022-09-19 DIAGNOSIS — L817 Pigmented purpuric dermatosis: Secondary | ICD-10-CM | POA: Diagnosis not present

## 2022-09-20 DIAGNOSIS — F411 Generalized anxiety disorder: Secondary | ICD-10-CM | POA: Diagnosis not present

## 2022-10-20 DIAGNOSIS — F411 Generalized anxiety disorder: Secondary | ICD-10-CM | POA: Diagnosis not present

## 2022-10-24 ENCOUNTER — Telehealth: Payer: BC Managed Care – PPO | Admitting: Internal Medicine

## 2022-10-24 NOTE — Progress Notes (Unsigned)
MyChart Video Visit    Virtual Visit via Video Note   This visit type was conducted due to national recommendations for restrictions regarding the COVID-19 Pandemic (e.g. social distancing) in an effort to limit this patient's exposure and mitigate transmission in our community. This patient is at least at moderate risk for complications without adequate follow up. This format is felt to be most appropriate for this patient at this time. Physical exam was limited by quality of the video and audio technology used for the visit. CMA was able to get the patient set up on a video visit.  Patient location: Home. Patient and provider in visit Provider location: Office  I discussed the limitations of evaluation and management by telemedicine and the availability of in person appointments. The patient expressed understanding and agreed to proceed.  Visit Date: 10/25/2022  Today's healthcare provider: Hetty Blend, NP-C     Subjective:    Patient ID: Andrew Bray, male    DOB: [birth year], 42 y.o.   MRN: [id number]  No chief complaint on file.   HPI  Past Medical History:  Diagnosis Date   Allergy    Anxiety    Depression    Heart murmur     No past surgical history on file.  Family History  Problem Relation Age of Onset   Cancer Father        Colon   Hypertension Father    Asthma Maternal Grandmother    Stroke Maternal Grandmother    Asthma Maternal Grandfather    Diabetes Maternal Grandfather    Stroke Maternal Grandfather    Cancer Paternal Grandmother    Hypertension Paternal Grandmother    Hypertension Paternal Grandfather    Arthritis Mother    Hyperlipidemia Mother    Hypertension Mother     Social History   Socioeconomic History   Marital status: Divorced    Spouse name: Not on file   Number of children: Not on file   Years of education: Not on file   Highest education level: Not on file  Occupational History   Not on file  Tobacco Use    Smoking status: Never   Smokeless tobacco: Never  Vaping Use   Vaping Use: Never used  Substance and Sexual Activity   Alcohol use: Not Currently   Drug use: Never   Sexual activity: Not Currently  Other Topics Concern   Not on file  Social History Narrative   Not on file   Social Determinants of Health   Financial Resource Strain: Not on file  Food Insecurity: Not on file  Transportation Needs: Not on file  Physical Activity: Not on file  Stress: Not on file  Social Connections: Not on file  Intimate Partner Violence: Not on file    Outpatient Medications Prior to Visit  Medication Sig Dispense Refill   amphetamine-dextroamphetamine (ADDERALL XR) 30 MG 24 hr capsule Take 30 mg by mouth every morning.     buPROPion (WELLBUTRIN) 100 MG tablet Take 100 mg by mouth 2 (two) times daily.     PROPRANOLOL HCL PO Take by mouth.     sertraline (ZOLOFT) 100 MG tablet Take 100 mg by mouth daily.     No facility-administered medications prior to visit.    Allergies  Allergen Reactions   Codeine    Prednisone     REACTION: pt states "jittery" from Pred    ROS     Objective:    Physical Exam  There were  no vitals taken for this visit. Wt Readings from Last 3 Encounters:  07/04/19 223 lb (101.2 kg)  02/06/16 257 lb (116.6 kg)  09/14/14 249 lb (112.9 kg)       Assessment & Plan:   Problem List Items Addressed This Visit   None   I am having Valarie Cones maintain his sertraline, buPROPion, amphetamine-dextroamphetamine, and PROPRANOLOL HCL PO.  No orders of the defined types were placed in this encounter.   I discussed the assessment and treatment plan with the patient. The patient was provided an opportunity to ask questions and all were answered. The patient agreed with the plan and demonstrated an understanding of the instructions.   The patient was advised to call back or seek an in-person evaluation if the symptoms worsen or if the condition fails to  improve as anticipated.  I provided *** minutes of face-to-face time during this encounter.   Harland Dingwall, NP-C [facility] [place] [phone] (phone) [phone] (fax)  [place]

## 2022-10-25 ENCOUNTER — Encounter: Payer: Self-pay | Admitting: Family Medicine

## 2022-10-25 ENCOUNTER — Telehealth (INDEPENDENT_AMBULATORY_CARE_PROVIDER_SITE_OTHER): Payer: BC Managed Care – PPO | Admitting: Family Medicine

## 2022-10-25 DIAGNOSIS — R058 Other specified cough: Secondary | ICD-10-CM | POA: Diagnosis not present

## 2022-10-25 MED ORDER — AZITHROMYCIN 250 MG PO TABS: ORAL_TABLET | ORAL | 0 refills | Status: AC

## 2022-10-25 MED ORDER — BENZONATATE 200 MG PO CAPS: 200.0000 mg | ORAL_CAPSULE | Freq: Two times a day (BID) | ORAL | 0 refills | Status: DC | PRN

## 2022-11-01 ENCOUNTER — Encounter: Payer: Self-pay | Admitting: Internal Medicine

## 2022-11-01 ENCOUNTER — Telehealth (INDEPENDENT_AMBULATORY_CARE_PROVIDER_SITE_OTHER): Payer: BC Managed Care – PPO | Admitting: Internal Medicine

## 2022-11-01 DIAGNOSIS — R058 Other specified cough: Secondary | ICD-10-CM | POA: Insufficient documentation

## 2022-11-01 MED ORDER — PREDNISONE 20 MG PO TABS
20.0000 mg | ORAL_TABLET | Freq: Every day | ORAL | 0 refills | Status: DC
Start: 2022-11-01 — End: 2023-01-17

## 2022-11-01 MED ORDER — BENZONATATE 200 MG PO CAPS: 200.0000 mg | ORAL_CAPSULE | Freq: Two times a day (BID) | ORAL | 0 refills | Status: DC | PRN

## 2022-11-01 MED ORDER — ALBUTEROL SULFATE HFA 108 (90 BASE) MCG/ACT IN AERS: 2.0000 | INHALATION_SPRAY | Freq: Four times a day (QID) | RESPIRATORY_TRACT | 2 refills | Status: DC | PRN

## 2022-11-01 MED ORDER — PREDNISONE 20 MG PO TABS: 20.0000 mg | ORAL_TABLET | Freq: Every day | ORAL | 0 refills | Status: DC

## 2022-11-01 NOTE — Progress Notes (Signed)
Virtual Visit via Video Note  I connected with Andrew Bray on 11/01/22 at  3:20 PM EST by a video enabled telemedicine application and verified that I am speaking with the correct person using two identifiers.  The patient and the provider were at separate locations throughout the entire encounter. Patient location: home, Provider location: work   I discussed the limitations of evaluation and management by telemedicine and the availability of in person appointments. The patient expressed understanding and agreed to proceed. The patient and the provider were the only parties present for the visit unless noted in HPI below.  History of Present Illness: The patient is a 42 y.o. man with visit for cough ongoing since Covid around [place]. Took azithromycin and improving but some wheezing and tired after doing stairs  Observations/Objective: Appearance: normal, breathing appears normal, some cough during visit, casual grooming  Assessment and Plan: See problem oriented charting  Follow Up Instructions: rx prednisone and albuterol inhaler  I discussed the assessment and treatment plan with the patient. The patient was provided an opportunity to ask questions and all were answered. The patient agreed with the plan and demonstrated an understanding of the instructions.   The patient was advised to call back or seek an in-person evaluation if the symptoms worsen or if the condition fails to improve as anticipated.  Hoyt Koch, MD

## 2022-11-01 NOTE — Assessment & Plan Note (Signed)
Suspect related to covid-19 and encouragement given that this can take 4-6 weeks for cough to resolve. Given some wheezing sound at night time will do course of prednisone 5 days. Rx albuterol for use prior to stairs. Refilled tessalon perles.

## 2022-12-13 DIAGNOSIS — F411 Generalized anxiety disorder: Secondary | ICD-10-CM | POA: Diagnosis not present

## 2022-12-22 DIAGNOSIS — F411 Generalized anxiety disorder: Secondary | ICD-10-CM | POA: Diagnosis not present

## 2023-01-12 DIAGNOSIS — F411 Generalized anxiety disorder: Secondary | ICD-10-CM | POA: Diagnosis not present

## 2023-01-17 ENCOUNTER — Encounter: Payer: Self-pay | Admitting: Internal Medicine

## 2023-01-17 ENCOUNTER — Ambulatory Visit (INDEPENDENT_AMBULATORY_CARE_PROVIDER_SITE_OTHER): Payer: BC Managed Care – PPO | Admitting: Internal Medicine

## 2023-01-17 VITALS — BP 150/90 | HR 109 | Temp 98.4°F | Ht 71.0 in | Wt 246.4 lb

## 2023-01-17 DIAGNOSIS — F988 Other specified behavioral and emotional disorders with onset usually occurring in childhood and adolescence: Secondary | ICD-10-CM

## 2023-01-17 DIAGNOSIS — Z0001 Encounter for general adult medical examination with abnormal findings: Secondary | ICD-10-CM

## 2023-01-17 DIAGNOSIS — I10 Essential (primary) hypertension: Secondary | ICD-10-CM | POA: Diagnosis not present

## 2023-01-17 LAB — COMPREHENSIVE METABOLIC PANEL
ALT: 21 U/L (ref 0–53)
AST: 21 U/L (ref 0–37)
Albumin: 4.2 g/dL (ref 3.5–5.2)
Alkaline Phosphatase: 60 U/L (ref 39–117)
BUN: 15 mg/dL (ref 6–23)
CO2: 29 mEq/L (ref 19–32)
Calcium: 9.2 mg/dL (ref 8.4–10.5)
Chloride: 101 mEq/L (ref 96–112)
Creatinine, Ser: 0.94 mg/dL (ref 0.40–1.50)
GFR: 99.51 mL/min (ref >60.00)
Glucose, Bld: 74 mg/dL (ref 70–99)
Potassium: 3.9 mEq/L (ref 3.5–5.1)
Sodium: 135 mEq/L (ref 135–145)
Total Bilirubin: 0.7 mg/dL (ref 0.2–1.2)
Total Protein: 7.2 g/dL (ref 6.0–8.3)

## 2023-01-17 LAB — LIPID PANEL
Cholesterol: 233 mg/dL — ABNORMAL HIGH (ref 0–200)
HDL: 57.1 mg/dL (ref >39.00)
Total CHOL/HDL Ratio: 4
Triglycerides: 546 mg/dL — ABNORMAL HIGH (ref 0.0–149.0)

## 2023-01-17 LAB — CBC
HCT: 45.1 % (ref 39.0–52.0)
Hemoglobin: 15.4 g/dL (ref 13.0–17.0)
MCHC: 34.2 g/dL (ref 30.0–36.0)
MCV: 85.9 fl (ref 78.0–100.0)
Platelets: 301 10*3/uL (ref 150.0–400.0)
RBC: 5.26 Mil/uL (ref 4.22–5.81)
RDW: 13.6 % (ref 11.5–15.5)
WBC: 10.2 10*3/uL (ref 4.0–10.5)

## 2023-01-17 LAB — HEMOGLOBIN A1C: Hgb A1c MFr Bld: 5.5 % (ref 4.6–6.5)

## 2023-01-17 LAB — LDL CHOLESTEROL, DIRECT: Direct LDL: 134 mg/dL

## 2023-01-17 MED ORDER — OLOPATADINE HCL 0.1 % OP SOLN: 1.0000 [drp] | Freq: Two times a day (BID) | OPHTHALMIC | 12 refills | Status: AC

## 2023-01-17 MED ORDER — VALSARTAN 80 MG PO TABS
80.0000 mg | ORAL_TABLET | Freq: Every day | ORAL | 3 refills | Status: DC
Start: 2023-01-17 — End: 2023-02-14

## 2023-01-17 MED ORDER — ALBUTEROL SULFATE HFA 108 (90 BASE) MCG/ACT IN AERS: 2.0000 | INHALATION_SPRAY | Freq: Four times a day (QID) | RESPIRATORY_TRACT | 2 refills | Status: DC | PRN

## 2023-01-17 NOTE — Patient Instructions (Signed)
We will check the labs today. We have sent in valsartan to take 1 pill daily to help the blood pressure.

## 2023-01-17 NOTE — Assessment & Plan Note (Signed)
EKG done consistent with LVH. Several elevated BP readings and family history of early htn. Rx valsartan 80 mg daily and continue propranolol BID (mg unknown). Follow up 3-4 weeks to assess BP and repeat labs. Checking CMP and CBC and lipid panel today.

## 2023-01-17 NOTE — Progress Notes (Signed)
   Subjective:   Patient ID: Andrew Bray, male    DOB: Dec 10, 1980, 42 y.o.   MRN: [id number]  HPI The patient is here for physical.  PMH, [facility], social history reviewed and updated  Review of Systems  Constitutional: Negative.   HENT: Negative.    Eyes: Negative.   Respiratory:  Negative for cough, chest tightness and shortness of breath.   Cardiovascular:  Negative for chest pain, palpitations and leg swelling.  Gastrointestinal:  Negative for abdominal distention, abdominal pain, constipation, diarrhea, nausea and vomiting.  Musculoskeletal: Negative.   Skin: Negative.   Neurological:  Positive for headaches.  Psychiatric/Behavioral: Negative.      Objective:  Physical Exam Constitutional:      Appearance: He is well-developed.  HENT:     Head: Normocephalic and atraumatic.  Cardiovascular:     Rate and Rhythm: Normal rate and regular rhythm.  Pulmonary:     Effort: Pulmonary effort is normal. No respiratory distress.     Breath sounds: Normal breath sounds. No wheezing or rales.  Abdominal:     General: Bowel sounds are normal. There is no distension.     Palpations: Abdomen is soft.     Tenderness: There is no abdominal tenderness. There is no rebound.  Musculoskeletal:     Cervical back: Normal range of motion.  Skin:    General: Skin is warm and dry.  Neurological:     Mental Status: He is alert and oriented to person, place, and time.     Coordination: Coordination normal.     Vitals:   01/17/23 1017 01/17/23 1020 01/17/23 1044  BP: (!) 180/120 (!) 180/120 (!) 150/90  Pulse: (!) 109    Temp: 98.4 F (36.9 C)    TempSrc: Oral    SpO2: 98%    Weight: 246 lb 6 oz (111.8 kg)    Height: 5\' 11"  (1.803 m)     EKG: Rate 94, axis normal, interval QTc 502, sinus, LBBB, LVH, no st or t wave changes, no prior to compare   Assessment & Plan:

## 2023-01-17 NOTE — Assessment & Plan Note (Signed)
Flu shot counseled. Tetanus up to date. Counseled about sun safety and mole surveillance. Counseled about the dangers of distracted driving. Given 10 year screening recommendations.

## 2023-01-17 NOTE — Assessment & Plan Note (Signed)
Seeing psych and severely elevated BP today. Is on adderall which can raise BP. EKG done with LVH so needs treatment of BP. If unable to control may need change of medication potentially.

## 2023-01-23 ENCOUNTER — Encounter: Payer: Self-pay | Admitting: Internal Medicine

## 2023-01-24 MED ORDER — ROSUVASTATIN CALCIUM 20 MG PO TABS
20.0000 mg | ORAL_TABLET | Freq: Every day | ORAL | 3 refills | Status: DC
Start: 2023-01-24 — End: 2024-01-17

## 2023-02-14 ENCOUNTER — Ambulatory Visit (INDEPENDENT_AMBULATORY_CARE_PROVIDER_SITE_OTHER): Payer: BC Managed Care – PPO | Admitting: Internal Medicine

## 2023-02-14 ENCOUNTER — Encounter: Payer: Self-pay | Admitting: Internal Medicine

## 2023-02-14 VITALS — BP 150/92 | HR 106 | Temp 98.3°F | Ht 71.0 in | Wt 243.0 lb

## 2023-02-14 DIAGNOSIS — I10 Essential (primary) hypertension: Secondary | ICD-10-CM | POA: Diagnosis not present

## 2023-02-14 DIAGNOSIS — E782 Mixed hyperlipidemia: Secondary | ICD-10-CM

## 2023-02-14 DIAGNOSIS — E785 Hyperlipidemia, unspecified: Secondary | ICD-10-CM | POA: Insufficient documentation

## 2023-02-14 LAB — COMPREHENSIVE METABOLIC PANEL
ALT: 24 U/L (ref 0–53)
AST: 21 U/L (ref 0–37)
Albumin: 4 g/dL (ref 3.5–5.2)
Alkaline Phosphatase: 53 U/L (ref 39–117)
BUN: 14 mg/dL (ref 6–23)
CO2: 27 mEq/L (ref 19–32)
Calcium: 9.2 mg/dL (ref 8.4–10.5)
Chloride: 102 mEq/L (ref 96–112)
Creatinine, Ser: 0.96 mg/dL (ref 0.40–1.50)
GFR: 96.97 mL/min (ref >60.00)
Glucose, Bld: 77 mg/dL (ref 70–99)
Potassium: 3.9 mEq/L (ref 3.5–5.1)
Sodium: 137 mEq/L (ref 135–145)
Total Bilirubin: 0.9 mg/dL (ref 0.2–1.2)
Total Protein: 6.9 g/dL (ref 6.0–8.3)

## 2023-02-14 MED ORDER — VALSARTAN 160 MG PO TABS: 160.0000 mg | ORAL_TABLET | Freq: Every day | ORAL | 3 refills | Status: DC

## 2023-02-14 NOTE — Progress Notes (Signed)
   Subjective:   Patient ID: Andrew Bray, male    DOB: 1981/02/17, 42 y.o.   MRN: [id number]  HPI The patient is a 42 YO man coming in for BP follow up just started valsartan 2-3 weeks ago.   Review of Systems  Constitutional: Negative.   HENT: Negative.    Eyes: Negative.   Respiratory:  Negative for cough, chest tightness and shortness of breath.   Cardiovascular:  Negative for chest pain, palpitations and leg swelling.  Gastrointestinal:  Negative for abdominal distention, abdominal pain, constipation, diarrhea, nausea and vomiting.  Musculoskeletal: Negative.   Skin: Negative.   Neurological: Negative.   Psychiatric/Behavioral: Negative.      Objective:  Physical Exam Constitutional:      Appearance: He is well-developed.  HENT:     Head: Normocephalic and atraumatic.  Cardiovascular:     Rate and Rhythm: Normal rate and regular rhythm.  Pulmonary:     Effort: Pulmonary effort is normal. No respiratory distress.     Breath sounds: Normal breath sounds. No wheezing or rales.  Abdominal:     General: Bowel sounds are normal. There is no distension.     Palpations: Abdomen is soft.     Tenderness: There is no abdominal tenderness. There is no rebound.  Musculoskeletal:     Cervical back: Normal range of motion.  Skin:    General: Skin is warm and dry.  Neurological:     Mental Status: He is alert and oriented to person, place, and time.     Coordination: Coordination normal.     Vitals:   02/14/23 1050 02/14/23 1102  BP: (!) 158/98 (!) 150/92  Pulse: (!) 106   Temp: 98.3 F (36.8 C)   TempSrc: Oral   SpO2: 99%   Weight: 243 lb (110.2 kg)   Height: 5\' 11"  (1.803 m)     Assessment & Plan:

## 2023-02-14 NOTE — Assessment & Plan Note (Signed)
BP improved but not quite at goal. Increase to valsartan 160 mg daily. Checking CMP today and follow up 3 months.

## 2023-02-14 NOTE — Patient Instructions (Signed)
We will check the labs today and will increase the valsartan to 160 mg daily. We have sent in a new prescription to the pharmacy and you can take 2 pills of the 80 mg valsartan you have at home until gone.

## 2023-02-14 NOTE — Assessment & Plan Note (Signed)
Started crestor 20 mg daily about 1 week ago and will recheck lipid panel at follow up in 3 months. No side effects.

## 2023-02-16 DIAGNOSIS — F411 Generalized anxiety disorder: Secondary | ICD-10-CM | POA: Diagnosis not present

## 2023-02-25 DIAGNOSIS — F411 Generalized anxiety disorder: Secondary | ICD-10-CM | POA: Diagnosis not present

## 2023-03-04 DIAGNOSIS — F411 Generalized anxiety disorder: Secondary | ICD-10-CM | POA: Diagnosis not present

## 2023-03-11 DIAGNOSIS — F411 Generalized anxiety disorder: Secondary | ICD-10-CM | POA: Diagnosis not present

## 2023-03-25 DIAGNOSIS — F411 Generalized anxiety disorder: Secondary | ICD-10-CM | POA: Diagnosis not present

## 2023-04-25 DIAGNOSIS — F411 Generalized anxiety disorder: Secondary | ICD-10-CM | POA: Diagnosis not present

## 2023-05-15 ENCOUNTER — Ambulatory Visit: Payer: BC Managed Care – PPO | Admitting: Internal Medicine

## 2023-05-16 ENCOUNTER — Ambulatory Visit: Payer: BC Managed Care – PPO | Admitting: Internal Medicine

## 2023-05-17 ENCOUNTER — Ambulatory Visit: Payer: BC Managed Care – PPO | Admitting: Internal Medicine

## 2023-05-23 ENCOUNTER — Ambulatory Visit: Payer: BC Managed Care – PPO | Admitting: Internal Medicine

## 2023-05-23 ENCOUNTER — Encounter: Payer: Self-pay | Admitting: Internal Medicine

## 2023-05-23 VITALS — BP 160/86 | HR 100 | Temp 100.0°F | Ht 71.0 in | Wt 247.0 lb

## 2023-05-23 DIAGNOSIS — E782 Mixed hyperlipidemia: Secondary | ICD-10-CM

## 2023-05-23 DIAGNOSIS — I10 Essential (primary) hypertension: Secondary | ICD-10-CM

## 2023-05-23 NOTE — Progress Notes (Signed)
   Subjective:   Patient ID: Andrew Bray, male    DOB: Aug 09, 1981, 42 y.o.   MRN: [id number]  HPI The patient is a 42 YO man coming in for follow up. Started BP and cholesterol medicine. Doing well and BP more controlled. No headaches or chest pains.  Review of Systems  Constitutional: Negative.   HENT: Negative.    Eyes: Negative.   Respiratory:  Negative for cough, chest tightness and shortness of breath.   Cardiovascular:  Negative for chest pain, palpitations and leg swelling.  Gastrointestinal:  Negative for abdominal distention, abdominal pain, constipation, diarrhea, nausea and vomiting.  Musculoskeletal: Negative.   Skin: Negative.   Neurological: Negative.   Psychiatric/Behavioral: Negative.      Objective:  Physical Exam Constitutional:      Appearance: He is well-developed.  HENT:     Head: Normocephalic and atraumatic.  Cardiovascular:     Rate and Rhythm: Normal rate and regular rhythm.  Pulmonary:     Effort: Pulmonary effort is normal. No respiratory distress.     Breath sounds: Normal breath sounds. No wheezing or rales.  Abdominal:     General: Bowel sounds are normal. There is no distension.     Palpations: Abdomen is soft.     Tenderness: There is no abdominal tenderness. There is no rebound.  Musculoskeletal:     Cervical back: Normal range of motion.  Skin:    General: Skin is warm and dry.  Neurological:     Mental Status: He is alert and oriented to person, place, and time.     Coordination: Coordination normal.     Vitals:   05/23/23 1439 05/23/23 1442 05/23/23 1502  BP: (!) 180/120 (!) 180/120 (!) 160/86  Pulse: 100    Temp: 100 F (37.8 C)    TempSrc: Oral    SpO2: 99%    Weight: 247 lb (112 kg)    Height: 5\' 11"  (1.803 m)      Assessment & Plan:

## 2023-05-23 NOTE — Assessment & Plan Note (Signed)
BP is coming down some and mostly running okay at home. He will check BP 1-2 times per week and send log. If >140/90 we will increase valsartan to 320 mg daily. If at goal will keep valsartan 160 mg daily. Checking CMP.

## 2023-05-23 NOTE — Assessment & Plan Note (Signed)
Just started crestor 20 mg daily about 3 months ago. Checking lipid panel and adjust as needed for LDL goal <100.

## 2023-05-23 NOTE — Patient Instructions (Signed)
Check the blood pressure 1-2 times per week and let us know what this is going. The goal is <140/90

## 2023-06-09 ENCOUNTER — Other Ambulatory Visit: Payer: Self-pay

## 2023-06-09 ENCOUNTER — Ambulatory Visit: Payer: BC Managed Care – PPO | Admitting: Nurse Practitioner

## 2023-06-09 ENCOUNTER — Emergency Department (HOSPITAL_BASED_OUTPATIENT_CLINIC_OR_DEPARTMENT_OTHER)
Admission: EM | Admit: 2023-06-09 | Discharge: 2023-06-09 | Disposition: A | Discharge status: Home / Self Care | Payer: BC Managed Care – PPO | Attending: Emergency Medicine | Admitting: Emergency Medicine

## 2023-06-09 DIAGNOSIS — Z79899 Other long term (current) drug therapy: Secondary | ICD-10-CM | POA: Diagnosis not present

## 2023-06-09 DIAGNOSIS — I10 Essential (primary) hypertension: Secondary | ICD-10-CM

## 2023-06-09 LAB — BASIC METABOLIC PANEL
Anion gap: 7 (ref 5–15)
BUN: 15 mg/dL (ref 6–20)
CO2: 27 mmol/L (ref 22–32)
Calcium: 9.4 mg/dL (ref 8.9–10.3)
Chloride: 103 mmol/L (ref 98–111)
Creatinine, Ser: 0.98 mg/dL (ref 0.61–1.24)
GFR, Estimated: >60 mL/min (ref >60)
Glucose, Bld: 84 mg/dL (ref 70–99)
Potassium: 4.1 mmol/L (ref 3.5–5.1)
Sodium: 137 mmol/L (ref 135–145)

## 2023-06-09 MED ORDER — AMLODIPINE BESYLATE 5 MG PO TABS
5.0000 mg | ORAL_TABLET | Freq: Once | ORAL | Status: AC
  Administered 2023-06-09: 5 mg via ORAL
  Filled 2023-06-09: qty 1

## 2023-06-09 MED ORDER — AMLODIPINE BESYLATE 5 MG PO TABS: 5.0000 mg | ORAL_TABLET | Freq: Every day | ORAL | 1 refills | Status: DC

## 2023-06-09 NOTE — ED Triage Notes (Signed)
Pt reports elevated BP this morning. States he takes BP medications and has been advised by his dr to check it at home periodically. Pt reports elevated BP's at home, but higher than normal today, about 190/104 at home per pt.  Pt denies headache, n/c, dizziness or other symtoms at this time. Pt aaox4, NAD in triage. Pt states he feels a bit anxious now, but did not feel anxious prior to checking his BP this morning.

## 2023-06-09 NOTE — ED Notes (Signed)
Discharge instructions discussed with pt. Pt verbalized understanding. Pt stable and ambulatory.  °

## 2023-06-09 NOTE — ED Provider Notes (Signed)
[facility] EM[facility]GENCY DEPARTMENT AT [facility] Provider Note   CSN: [id number] Arrival date & time: 06/09/23  1127     History  Chief Complaint  Patient presents with   Hypertension    Andrew Oelschlager. is a 42 y.o. male.   Hypertension   42 year old male, history of anxiety, ADD, hypertension, taking valsartan and propranolol although he states the propranolol was more for anxiety.  He was started on these medications at his last doctor's office for the blood pressure however he has not been measuring his blood pressure at home.  This morning his dad told him that he wanted to check his blood pressure because he has not checked it since his doctor's appointment.  It came back very elevated with a blood pressure of somewhere around 180/120.  He was very concerned after doing a search on the Internet for things that can happen with high blood pressure and even though he is completely asymptomatic he came to get checked and evaluated.    Home Medications Prior to Admission medications   Medication Sig Start Date End Date Taking? Authorizing Provider  amLODipine (NORVASC) 5 MG tablet Take 1 tablet (5 mg total) by mouth daily. 06/09/23  Yes Eber Hong, MD  amphetamine-dextroamphetamine (ADDERALL XR) 30 MG 24 hr capsule Take 30 mg by mouth every morning.    [provider]  buPROPion (WELLBUTRIN) 100 MG tablet Take 100 mg by mouth 2 (two) times daily.    [provider]  olopatadine (PATANOL) 0.1 % ophthalmic solution Place 1 drop into both eyes 2 (two) times daily. 01/17/23   Myrlene Broker, MD  PROPRANOLOL HCL PO Take by mouth.    [provider]  rosuvastatin (CRESTOR) 20 MG tablet Take 1 tablet (20 mg total) by mouth daily. 01/24/23   Myrlene Broker, MD  sertraline (ZOLOFT) 100 MG tablet Take 100 mg by mouth daily.    [provider]  valsartan (DIOVAN) 160 MG tablet Take 1 tablet (160 mg total) by mouth daily. 02/14/23    Myrlene Broker, MD      Allergies    Codeine and Prednisone    Review of Systems   Review of Systems  All other systems reviewed and are negative.   Physical Exam Updated Vital Signs BP (!) 186/120 (BP Location: Left Arm)   Pulse (!) 103   Temp 98.2 F (36.8 C)   Resp 18   SpO2 100%  Physical Exam Vitals and nursing note reviewed.  Constitutional:      General: He is not in acute distress.    Appearance: He is well-developed.  HENT:     Head: Normocephalic and atraumatic.     Mouth/Throat:     Pharynx: No oropharyngeal exudate.  Eyes:     General: No scleral icterus.       Right eye: No discharge.        Left eye: No discharge.     Conjunctiva/sclera: Conjunctivae normal.     Pupils: Pupils are equal, round, and reactive to light.  Neck:     Thyroid: No thyromegaly.     Vascular: No JVD.  Cardiovascular:     Rate and Rhythm: Normal rate and regular rhythm.     Heart sounds: Normal heart sounds. No murmur heard.    No friction rub. No gallop.  Pulmonary:     Effort: Pulmonary effort is normal. No respiratory distress.     Breath sounds: Normal breath sounds. No wheezing  or rales.  Abdominal:     General: Bowel sounds are normal. There is no distension.     Palpations: Abdomen is soft. There is no mass.     Tenderness: There is no abdominal tenderness.  Musculoskeletal:        General: No tenderness. Normal range of motion.     Cervical back: Normal range of motion and neck supple.     Right lower leg: No edema.     Left lower leg: No edema.  Lymphadenopathy:     Cervical: No cervical adenopathy.  Skin:    General: Skin is warm and dry.     Findings: No erythema or rash.  Neurological:     Mental Status: He is alert.     Coordination: Coordination normal.  Psychiatric:        Behavior: Behavior normal.     ED Results / Procedures / Treatments   Labs (all labs ordered are listed, but only abnormal results are displayed) Labs Reviewed  BASIC  METABOLIC PANEL    EKG EKG Interpretation Date/Time:  Friday June 09 2023 11:44:19 EDT Ventricular Rate:  96 PR Interval:  176 QRS Duration:  118 QT Interval:  396 QTC Calculation: 500 R Axis:   20  Text Interpretation: Normal sinus rhythm Possible Left atrial enlargement Left ventricular hypertrophy with QRS widening ( Cornell product ) Nonspecific T wave abnormality Prolonged QT Abnormal ECG No previous ECGs available Confirmed by Eber Hong ([id number]) on 06/09/2023 11:54:49 AM  Radiology No results found.  Procedures Procedures    Medications Ordered in ED Medications  amLODipine (NORVASC) tablet 5 mg (has no administration in time range)    ED Course/ Medical Decision Making/ A&P                                 Medical Decision Making Amount and/or Complexity of Data Reviewed Labs: ordered.  Risk Prescription drug management.   Thankfully the patient's exam is unremarkable, his EKG does confirm that he has LVH which is not unsurprising since he has uncontrolled hypertension.  Will check his metabolic panel to make sure that his kidney function is okay before starting additional medications.  I have asked him to measure his blood pressure every single day at the same time to make sure that he can share these numbers with his doctor to have further medication management and adjustments.  There is no signs of ischemia either symptomatically or objectively on the EKG.  I do not think he needs a troponin.  The patient is completely agreeable to the plan and appears stable otherwise.  Metabolic panel unremarkable, no signs of renal dysfunction, amlodipine given, patient kept on a monitor without arrhythmia, no chest pain or shortness of breath, home with the same medication, instructed on how to take his blood pressure, when to take his blood pressure and how to take his medicines, he is in total agreement.        Final Clinical Impression(s) / ED Diagnoses Final  diagnoses:  Essential hypertension    Rx / DC Orders ED Discharge Orders          Ordered    amLODipine (NORVASC) 5 MG tablet  Daily        06/09/23 1319              Eber Hong, MD 06/09/23 1320

## 2023-06-09 NOTE — Discharge Instructions (Signed)
In addition to your home medications I would like for you to start taking 1 tablet of amlodipine every day.  This medication will be used in addition to your other medications.  I want you to measure your blood pressure every day, 2 hours after you take your medication and record this number to share with your doctor.  You should be seen at their office within 2 weeks for recheck of your blood pressure.  Please make sure that you take your blood pressure machine with you to the office to make sure that it is correctly measuring in comparison to their equipment.  ER for severe or worsening symptoms

## 2023-06-12 ENCOUNTER — Telehealth: Payer: BC Managed Care – PPO | Admitting: Family Medicine

## 2023-06-15 DIAGNOSIS — F411 Generalized anxiety disorder: Secondary | ICD-10-CM | POA: Diagnosis not present

## 2023-06-21 ENCOUNTER — Telehealth: Payer: Self-pay

## 2023-06-21 NOTE — Telephone Encounter (Signed)
Transition Care Management Follow-up Telephone Call Date of discharge and from where: [facility] 8/23 How have you been since you were released from the hospital? Doing great and following up with PCP 9/5 Any questions or concerns? No  Items Reviewed: Did the pt receive and understand the discharge instructions provided? Yes  Medications obtained and verified? Yes  Other? No  Any new allergies since your discharge? No  Dietary orders reviewed? No Do you have support at home? Yes     Follow up appointments reviewed:  PCP Hospital f/u appt confirmed? Yes  Scheduled to see PCP on 9/5 @ . Specialist Hospital f/u appt confirmed? No  Scheduled to see  on  @ . Are transportation arrangements needed? No  If their condition worsens, is the pt aware to call PCP or go to the Emergency Dept.? Yes Was the patient provided with contact information for the PCP's office or ED? Yes Was to pt encouraged to call back with questions or concerns? Yes

## 2023-06-22 ENCOUNTER — Ambulatory Visit: Payer: BC Managed Care – PPO | Admitting: Internal Medicine

## 2023-06-22 ENCOUNTER — Encounter: Payer: Self-pay | Admitting: Internal Medicine

## 2023-06-22 VITALS — BP 168/102 | HR 104 | Temp 98.2°F | Ht 71.0 in | Wt 245.0 lb

## 2023-06-22 DIAGNOSIS — I10 Essential (primary) hypertension: Secondary | ICD-10-CM | POA: Diagnosis not present

## 2023-06-22 DIAGNOSIS — Z23 Encounter for immunization: Secondary | ICD-10-CM

## 2023-06-22 MED ORDER — VALSARTAN 320 MG PO TABS: 320.0000 mg | ORAL_TABLET | Freq: Every day | ORAL | 3 refills | Status: AC

## 2023-06-22 NOTE — Assessment & Plan Note (Addendum)
BP is still elevated. Will stop amlodipine 5 mg daily and increase valsartan to 320 mg daily. New rx done. Follow up 1 month for BP control and BMP.

## 2023-06-22 NOTE — Progress Notes (Signed)
   Subjective:   Patient ID: Andrew Ra., male    DOB: November 05, 1980, 42 y.o.   MRN: [id number]  HPI The patient is a 42 YO man coming in for blood pressure follow up. ER visit for high BP  Review of Systems  Constitutional: Negative.   HENT: Negative.    Eyes: Negative.   Respiratory:  Negative for cough, chest tightness and shortness of breath.   Cardiovascular:  Negative for chest pain, palpitations and leg swelling.  Gastrointestinal:  Negative for abdominal distention, abdominal pain, constipation, diarrhea, nausea and vomiting.  Musculoskeletal: Negative.   Skin: Negative.   Neurological: Negative.   Psychiatric/Behavioral: Negative.      Objective:  Physical Exam Constitutional:      Appearance: He is well-developed.  HENT:     Head: Normocephalic and atraumatic.  Cardiovascular:     Rate and Rhythm: Normal rate and regular rhythm.  Pulmonary:     Effort: Pulmonary effort is normal. No respiratory distress.     Breath sounds: Normal breath sounds. No wheezing or rales.  Abdominal:     General: Bowel sounds are normal. There is no distension.     Palpations: Abdomen is soft.     Tenderness: There is no abdominal tenderness. There is no rebound.  Musculoskeletal:     Cervical back: Normal range of motion.  Skin:    General: Skin is warm and dry.  Neurological:     Mental Status: He is alert and oriented to person, place, and time.     Coordination: Coordination normal.     Vitals:   06/22/23 0945 06/22/23 0953  BP: (!) 158/100 (!) 168/102  Pulse: (!) 104   Temp: 98.2 F (36.8 C)   TempSrc: Oral   SpO2: 96%   Weight: 245 lb (111.1 kg)   Height: 5\' 11"  (1.803 m)     Assessment & Plan:

## 2023-06-22 NOTE — Patient Instructions (Signed)
We will stop the amlodipine and increase the valsartan to 320 mg daily. Until you run out you can take 2 pills of the valsartan 160 mg. Then fill the new prescription and take 1 pill daily.

## 2023-07-06 DIAGNOSIS — F411 Generalized anxiety disorder: Secondary | ICD-10-CM | POA: Diagnosis not present

## 2023-07-13 DIAGNOSIS — H5213 Myopia, bilateral: Secondary | ICD-10-CM | POA: Diagnosis not present

## 2023-07-13 DIAGNOSIS — H04123 Dry eye syndrome of bilateral lacrimal glands: Secondary | ICD-10-CM | POA: Diagnosis not present

## 2023-07-20 DIAGNOSIS — F411 Generalized anxiety disorder: Secondary | ICD-10-CM | POA: Diagnosis not present

## 2023-07-27 DIAGNOSIS — F411 Generalized anxiety disorder: Secondary | ICD-10-CM | POA: Diagnosis not present

## 2023-07-28 ENCOUNTER — Telehealth: Payer: Self-pay | Admitting: Internal Medicine

## 2023-07-28 NOTE — Telephone Encounter (Signed)
Rx for Valsartan needs to be changed to reflect dosage change.  Pharmacy is going to refill the old rx but a new one will need to be sent to [facility]

## 2023-07-31 NOTE — Telephone Encounter (Signed)
New rx for 320 mg valsartan daily was done at visit in Sept and he is overdue for follow up visit.

## 2023-07-31 NOTE — Telephone Encounter (Signed)
Called pt and [facility] mailbox is full

## 2023-08-10 DIAGNOSIS — F411 Generalized anxiety disorder: Secondary | ICD-10-CM | POA: Diagnosis not present

## 2023-08-26 DIAGNOSIS — F411 Generalized anxiety disorder: Secondary | ICD-10-CM | POA: Diagnosis not present

## 2023-09-19 DIAGNOSIS — F411 Generalized anxiety disorder: Secondary | ICD-10-CM | POA: Diagnosis not present

## 2023-09-25 ENCOUNTER — Telehealth: Payer: Self-pay | Admitting: Internal Medicine

## 2023-09-25 NOTE — Telephone Encounter (Signed)
Valsartan - Patient was told to take 2 of the 160 mg

## 2023-10-19 DIAGNOSIS — F411 Generalized anxiety disorder: Secondary | ICD-10-CM | POA: Diagnosis not present

## 2023-11-02 DIAGNOSIS — F411 Generalized anxiety disorder: Secondary | ICD-10-CM | POA: Diagnosis not present

## 2024-01-16 ENCOUNTER — Other Ambulatory Visit: Payer: Self-pay | Admitting: Internal Medicine

## 2024-01-16 NOTE — Telephone Encounter (Signed)
 Copied from CRM [id number]. Topic: Clinical - Medication Refill >> Jan 16, 2024  8:31 AM Alcus Dad wrote: Most Recent Primary Care Visit:  Provider: Hillard Danker A  Department: [facility]  Visit Type: OFFICE VISIT  Date: 06/22/2023  Medication: rosuvastatin (CRESTOR) 20 MG tablet  Has the patient contacted their pharmacy? Yes (Agent: If no, request that the patient contact the pharmacy for the refill. If patient does not wish to contact the pharmacy document the reason why and proceed with request.) (Agent: If yes, when and what did the pharmacy advise?)  Is this the correct pharmacy for this prescription? Yes If no, delete pharmacy and type the correct one.  This is the patient's preferred pharmacy:   [id number] [place], [place] - [address] [place] [address] Phone: [phone] Fax: [phone]   Has the prescription been filled recently? Yes  Is the patient out of the medication? No  Has the patient been seen for an appointment in the last year OR does the patient have an upcoming appointment? Yes  Can we respond through MyChart? Yes  Agent: Please be advised that Rx refills may take up to 3 business days. We ask that you follow-up with your pharmacy.

## 2024-01-17 ENCOUNTER — Other Ambulatory Visit: Payer: Self-pay | Admitting: Internal Medicine

## 2024-05-31 DIAGNOSIS — M7541 Impingement syndrome of right shoulder: Secondary | ICD-10-CM | POA: Diagnosis not present

## 2024-05-31 DIAGNOSIS — M7542 Impingement syndrome of left shoulder: Secondary | ICD-10-CM | POA: Diagnosis not present

## 2024-06-13 DIAGNOSIS — F411 Generalized anxiety disorder: Secondary | ICD-10-CM | POA: Diagnosis not present

## 2024-07-12 ENCOUNTER — Other Ambulatory Visit: Payer: Self-pay | Admitting: Internal Medicine

## 2024-07-15 DIAGNOSIS — L738 Other specified follicular disorders: Secondary | ICD-10-CM | POA: Diagnosis not present

## 2024-07-15 DIAGNOSIS — D225 Melanocytic nevi of trunk: Secondary | ICD-10-CM | POA: Diagnosis not present

## 2024-07-15 DIAGNOSIS — L821 Other seborrheic keratosis: Secondary | ICD-10-CM | POA: Diagnosis not present

## 2024-07-15 DIAGNOSIS — D2262 Melanocytic nevi of left upper limb, including shoulder: Secondary | ICD-10-CM | POA: Diagnosis not present

## 2024-08-06 DIAGNOSIS — F411 Generalized anxiety disorder: Secondary | ICD-10-CM | POA: Diagnosis not present

## 2024-11-13 ENCOUNTER — Encounter: Payer: Self-pay | Admitting: *Deleted

## 2024-11-13 NOTE — Progress Notes (Signed)
 Andrew Bray.                                          MRN: [id number]   11/13/2024   The [facility] Quality Team Specialist reviewed this patient medical record for the purposes of chart review for care gap closure. The following were reviewed: chart review for care gap closure-controlling blood pressure.    [facility] Quality Team
